# Patient Record
Sex: Female | Born: 1975 | Race: White | Hispanic: No | Marital: Married | Smoking: Current some day smoker
Health system: Southern US, Community
[De-identification: ages and names within clinical notes are randomized; demographics above are authoritative.]

## PROBLEM LIST (undated history)

## (undated) DIAGNOSIS — R569 Unspecified convulsions: Secondary | ICD-10-CM

## (undated) HISTORY — DX: Unspecified convulsions: R56.9

## (undated) HISTORY — PX: TUBAL LIGATION: SHX77

## (undated) HISTORY — PX: CHOLECYSTECTOMY: SHX55

---

## 1998-06-20 ENCOUNTER — Other Ambulatory Visit: Admission: RE | Admit: 1998-06-20 | Discharge: 1998-06-20 | Payer: Self-pay | Admitting: Obstetrics & Gynecology

## 1999-07-02 ENCOUNTER — Other Ambulatory Visit: Admission: RE | Admit: 1999-07-02 | Discharge: 1999-07-02 | Payer: Self-pay | Admitting: Obstetrics & Gynecology

## 1999-10-29 ENCOUNTER — Encounter (HOSPITAL_COMMUNITY): Admission: RE | Admit: 1999-10-29 | Discharge: 1999-11-26 | Payer: Self-pay | Admitting: Obstetrics and Gynecology

## 1999-11-25 ENCOUNTER — Encounter (INDEPENDENT_AMBULATORY_CARE_PROVIDER_SITE_OTHER): Payer: Self-pay

## 1999-11-25 ENCOUNTER — Inpatient Hospital Stay (HOSPITAL_COMMUNITY): Admission: AD | Admit: 1999-11-25 | Discharge: 1999-11-28 | Payer: Self-pay | Admitting: Obstetrics and Gynecology

## 1999-11-30 ENCOUNTER — Encounter: Admission: RE | Admit: 1999-11-30 | Discharge: 2000-01-06 | Payer: Self-pay | Admitting: Obstetrics & Gynecology

## 2003-11-27 ENCOUNTER — Emergency Department (HOSPITAL_COMMUNITY): Admission: EM | Admit: 2003-11-27 | Discharge: 2003-11-28 | Payer: Self-pay | Admitting: Emergency Medicine

## 2003-12-01 ENCOUNTER — Emergency Department (HOSPITAL_COMMUNITY): Admission: EM | Admit: 2003-12-01 | Discharge: 2003-12-01 | Payer: Self-pay | Admitting: *Deleted

## 2004-07-05 ENCOUNTER — Inpatient Hospital Stay: Payer: Self-pay

## 2005-02-07 ENCOUNTER — Encounter: Admission: RE | Admit: 2005-02-07 | Discharge: 2005-02-07 | Payer: Self-pay | Admitting: Family Medicine

## 2005-12-06 ENCOUNTER — Observation Stay: Payer: Self-pay | Admitting: Unknown Physician Specialty

## 2005-12-13 ENCOUNTER — Other Ambulatory Visit: Payer: Self-pay

## 2005-12-13 ENCOUNTER — Emergency Department: Payer: Self-pay | Admitting: Emergency Medicine

## 2005-12-14 ENCOUNTER — Ambulatory Visit: Payer: Self-pay | Admitting: Emergency Medicine

## 2006-03-05 ENCOUNTER — Inpatient Hospital Stay: Payer: Self-pay

## 2006-03-18 ENCOUNTER — Ambulatory Visit: Payer: Self-pay | Admitting: Unknown Physician Specialty

## 2006-03-31 ENCOUNTER — Emergency Department: Payer: Self-pay | Admitting: Emergency Medicine

## 2006-04-28 ENCOUNTER — Ambulatory Visit: Payer: Self-pay | Admitting: Unknown Physician Specialty

## 2006-12-17 ENCOUNTER — Emergency Department: Payer: Self-pay | Admitting: Emergency Medicine

## 2007-11-16 ENCOUNTER — Emergency Department: Payer: Self-pay | Admitting: Emergency Medicine

## 2008-11-23 ENCOUNTER — Emergency Department: Payer: Self-pay | Admitting: Emergency Medicine

## 2008-12-22 ENCOUNTER — Emergency Department: Payer: Self-pay | Admitting: Internal Medicine

## 2009-11-26 ENCOUNTER — Emergency Department: Payer: Self-pay | Admitting: Emergency Medicine

## 2009-12-04 ENCOUNTER — Encounter: Admission: RE | Admit: 2009-12-04 | Discharge: 2009-12-04 | Payer: Self-pay | Admitting: Diagnostic Neuroimaging

## 2010-02-08 ENCOUNTER — Emergency Department: Payer: Self-pay | Admitting: Emergency Medicine

## 2010-02-14 ENCOUNTER — Emergency Department: Payer: Self-pay | Admitting: Unknown Physician Specialty

## 2010-08-21 NOTE — H&P (Signed)
[facility]  Patient:    Bonnie Stone, Bonnie Stone [facility]                    MRN: [id number] Adm. Date:  [id number] Attending:  Shaune Spittle Dictator:   Mack Guise, C.N.M.                         History and Physical  HISTORY OF PRESENT ILLNESS:   Ms. Horsford is a 35 year old gravida 4, para 2-0-1-2, who presents at 38 weeks for induction of labor, secondary to IUGR, cervix 3 cm dilated, 80% effaced with the _________ presenting part at a minus 1 station.  Her pregnancy has been followed by the CNM service at [facility] and is remarkable for:  1. Late prenatal care; 2. Smoker, approximately one pack per day; 3. History of preterm labor with full term delivery; 4. Group B Strep negative.  She was initially seen at the office of [facility] on June 11, 1999, at approximately [redacted] weeks gestation.  Patient was transferred to the M.D. service at 34 weeks, secondary to IUGR noted with serial pregnancy ultrasonography with growth less than 3%.  PRENATAL LABORATORY:          On July 02, 1999, hemoglobin and hematocrit 12.4 and 36.8, platelets 254,000.  Blood type and Rh O positive, antibody screen negative.  VDRL nonreactive, Rubella immune, Hepatitis B surface antigen negative, GC and chlamydia negative.  AFP/free beta HCG within normal range.  On Aug 31, 1999, at 28 weeks, one hour glucose challenge 80 and hemoglobin 12.4.  Culture of vaginal tract at 36 weeks is negative for Group B Strep.  OB HISTORY:                   1991, induced AB; 1995, normal spontaneous vaginal delivery with the birth of a 6 pound 11 ounce female infant with no complications at term; 1997, normal spontaneous vaginal delivery with the birth of a 6 pound 7 ounce female infant with no complications at term, and the present pregnancy.  MEDICAL HISTORY:              History of abnormal Pap smear, history of asthma.  FAMILY HISTORY:               Mother with chronic hypertension, mother  and sister with varicose veins, mother and maternal grandmother with a history of diabetes.  Patients sister with psychiatric disease.  GENETIC HISTORY:              Father of the baby, clubbed foot, and father of the babys nephew, mental retardation and seizures.  SOCIAL HISTORY:               Ms. Latterell is a married 35 year old Caucasian female who works as a Futures trader.  Her husband, [name], is involved and supportive.  He works as an Teacher, adult education.  They are nondenominational in their faith.  REVIEW OF SYSTEMS:            There are no signs or symptoms suggestive of focal or systemic disease and the patient is typical of one with a uterine pregnancy at term.  ALLERGIES:                    PENICILLIN.  HABITS:                       Patient smokes approximately one half  pack of cigarettes per day.  She denies the use of alcohol or illicit drugs.  PHYSICAL EXAMINATION:  VITAL SIGNS:                  Stable, afebrile, fetal heart rate reactive and reassuring.  PELVIC:                       Digital exam of the cervix finds it to be 3 cm dilated, 80% effaced with the _________ presenting part at a minus 1 station, artificial rupture of membranes performed with return of clear fluid.  HEENT:                        Unremarkable.  LUNGS:                        Clear.  HEART:                        Regular rate and rhythm.  ABDOMEN:                      Gravid in its contour.  Uterine fundus is noted to extend 36 cm above the level of the pubic symphysis.  Leopolds maneuver finds the infant to be in a longitudinal lie, cephalic presentation, and the estimated fetal weight is 5-1/2 to 6 pounds.  ASSESSMENT:                   Intrauterine pregnancy at 38 weeks, induction of labor secondary to IUGR.  PLAN:                         Admit to birthing suites per Dr. Cleatrice Burke, routine M.D. orders.  Plan artificial rupture of membranes and Pit induction if no spontaneous labor  occurs. DD:  11/25/99 TD:  11/25/99 Job: [id number]

## 2016-04-24 ENCOUNTER — Emergency Department: Payer: Medicaid Other

## 2016-04-24 ENCOUNTER — Emergency Department
Admission: EM | Admit: 2016-04-24 | Discharge: 2016-04-24 | Disposition: A | Discharge status: Home / Self Care | Payer: Medicaid Other | Attending: Emergency Medicine | Admitting: Emergency Medicine

## 2016-04-24 ENCOUNTER — Encounter: Payer: Self-pay | Admitting: Emergency Medicine

## 2016-04-24 DIAGNOSIS — R51 Headache: Secondary | ICD-10-CM | POA: Insufficient documentation

## 2016-04-24 DIAGNOSIS — F17200 Nicotine dependence, unspecified, uncomplicated: Secondary | ICD-10-CM | POA: Diagnosis not present

## 2016-04-24 DIAGNOSIS — Z5321 Procedure and treatment not carried out due to patient leaving prior to being seen by health care provider: Secondary | ICD-10-CM | POA: Diagnosis not present

## 2016-04-24 LAB — CBC
HCT: 41.5 % (ref 35.0–47.0)
Hemoglobin: 14.4 g/dL (ref 12.0–16.0)
MCH: 33.5 pg (ref 26.0–34.0)
MCHC: 34.8 g/dL (ref 32.0–36.0)
MCV: 96.2 fL (ref 80.0–100.0)
Platelets: 237 K/uL (ref 150–440)
RBC: 4.32 MIL/uL (ref 3.80–5.20)
RDW: 12.5 % (ref 11.5–14.5)
WBC: 9.6 K/uL (ref 3.6–11.0)

## 2016-04-24 LAB — BASIC METABOLIC PANEL WITH GFR
Anion gap: 9 (ref 5–15)
BUN: 9 mg/dL (ref 6–20)
CO2: 26 mmol/L (ref 22–32)
Calcium: 9 mg/dL (ref 8.9–10.3)
Chloride: 101 mmol/L (ref 101–111)
Creatinine, Ser: 0.66 mg/dL (ref 0.44–1.00)
GFR calc Af Amer: >60 mL/min (ref >60)
GFR calc non Af Amer: >60 mL/min (ref >60)
Glucose, Bld: 103 mg/dL — ABNORMAL HIGH (ref 65–99)
Potassium: 3.3 mmol/L — ABNORMAL LOW (ref 3.5–5.1)
Sodium: 136 mmol/L (ref 135–145)

## 2016-04-24 LAB — TROPONIN I: Troponin I: <0.03 ng/mL (ref <0.03)

## 2016-04-24 NOTE — ED Notes (Signed)
Called for pt in lobby; she had been with minor daughter who was being seen in [facility]; called [facility]-patient's daughter has been discharged and they were moved to the lobby; this patient is not answering at this time;

## 2016-04-24 NOTE — ED Triage Notes (Signed)
Pt states has had chest pain for 2 weeks. Pt states is intermittent. Pt states last night she began to have a "migraine" and a nosebleed. Pt states she has felt shob with associated chest pain. Pt appears in no acute distress in triage.

## 2017-04-20 ENCOUNTER — Emergency Department: Payer: Medicaid Other

## 2017-04-20 ENCOUNTER — Encounter: Payer: Self-pay | Admitting: *Deleted

## 2017-04-20 ENCOUNTER — Emergency Department
Admission: EM | Admit: 2017-04-20 | Discharge: 2017-04-21 | Disposition: A | Discharge status: Home / Self Care | Payer: Medicaid Other | Attending: Emergency Medicine | Admitting: Emergency Medicine

## 2017-04-20 ENCOUNTER — Other Ambulatory Visit: Payer: Self-pay

## 2017-04-20 DIAGNOSIS — R300 Dysuria: Secondary | ICD-10-CM | POA: Insufficient documentation

## 2017-04-20 DIAGNOSIS — E869 Volume depletion, unspecified: Secondary | ICD-10-CM | POA: Insufficient documentation

## 2017-04-20 DIAGNOSIS — F17200 Nicotine dependence, unspecified, uncomplicated: Secondary | ICD-10-CM | POA: Insufficient documentation

## 2017-04-20 DIAGNOSIS — E876 Hypokalemia: Secondary | ICD-10-CM | POA: Insufficient documentation

## 2017-04-20 DIAGNOSIS — R112 Nausea with vomiting, unspecified: Secondary | ICD-10-CM | POA: Diagnosis present

## 2017-04-20 DIAGNOSIS — K529 Noninfective gastroenteritis and colitis, unspecified: Secondary | ICD-10-CM

## 2017-04-20 DIAGNOSIS — R569 Unspecified convulsions: Secondary | ICD-10-CM | POA: Diagnosis not present

## 2017-04-20 LAB — URINALYSIS, COMPLETE (UACMP) WITH MICROSCOPIC
Bacteria, UA: NONE SEEN
Bilirubin Urine: NEGATIVE
Glucose, UA: NEGATIVE mg/dL
Ketones, ur: NEGATIVE mg/dL
Nitrite: NEGATIVE
Protein, ur: 100 mg/dL — AB
Specific Gravity, Urine: 1.012 (ref 1.005–1.030)
pH: 5 (ref 5.0–8.0)

## 2017-04-20 LAB — MAGNESIUM: Magnesium: 1.7 mg/dL (ref 1.7–2.4)

## 2017-04-20 LAB — COMPREHENSIVE METABOLIC PANEL
ALT: 12 U/L — ABNORMAL LOW (ref 14–54)
AST: 17 U/L (ref 15–41)
Albumin: 3.6 g/dL (ref 3.5–5.0)
Alkaline Phosphatase: 61 U/L (ref 38–126)
Anion gap: 12 (ref 5–15)
BUN: 6 mg/dL (ref 6–20)
CO2: 23 mmol/L (ref 22–32)
Calcium: 8.9 mg/dL (ref 8.9–10.3)
Chloride: 100 mmol/L — ABNORMAL LOW (ref 101–111)
Creatinine, Ser: 0.85 mg/dL (ref 0.44–1.00)
GFR calc Af Amer: >60 mL/min (ref >60)
GFR calc non Af Amer: >60 mL/min (ref >60)
Glucose, Bld: 108 mg/dL — ABNORMAL HIGH (ref 65–99)
Potassium: 2.8 mmol/L — ABNORMAL LOW (ref 3.5–5.1)
Sodium: 135 mmol/L (ref 135–145)
Total Bilirubin: 1.1 mg/dL (ref 0.3–1.2)
Total Protein: 7.7 g/dL (ref 6.5–8.1)

## 2017-04-20 LAB — LIPASE, BLOOD: Lipase: 22 U/L (ref 11–51)

## 2017-04-20 LAB — CBC
HCT: 39.8 % (ref 35.0–47.0)
Hemoglobin: 13.8 g/dL (ref 12.0–16.0)
MCH: 32.7 pg (ref 26.0–34.0)
MCHC: 34.8 g/dL (ref 32.0–36.0)
MCV: 94.1 fL (ref 80.0–100.0)
Platelets: 258 10*3/uL (ref 150–440)
RBC: 4.23 MIL/uL (ref 3.80–5.20)
RDW: 11.9 % (ref 11.5–14.5)
WBC: 17.9 10*3/uL — ABNORMAL HIGH (ref 3.6–11.0)

## 2017-04-20 MED ORDER — POTASSIUM CHLORIDE 10 MEQ/100ML IV SOLN
10.0000 meq | Freq: Once | INTRAVENOUS | Status: AC
  Administered 2017-04-21: 10 meq via INTRAVENOUS
  Filled 2017-04-20: qty 100

## 2017-04-20 MED ORDER — ONDANSETRON HCL 4 MG/2ML IJ SOLN
4.0000 mg | INTRAMUSCULAR | Status: AC
  Administered 2017-04-21: 4 mg via INTRAVENOUS
  Filled 2017-04-20: qty 2

## 2017-04-20 MED ORDER — SODIUM CHLORIDE 0.9 % IV BOLUS (SEPSIS)
1000.0000 mL | INTRAVENOUS | Status: AC
  Administered 2017-04-21: 1000 mL via INTRAVENOUS

## 2017-04-20 NOTE — ED Notes (Signed)
Urine pregnancy POC negative.

## 2017-04-20 NOTE — ED Provider Notes (Signed)
[facility] Emergency Department Provider Note  ____________________________________________   First MD Initiated Contact with Patient 04/20/17 2258     (approximate)  I have reviewed the triage vital signs and the nursing notes.   HISTORY  Chief Complaint Emesis    HPI EMMALYN Stone is a 42 y.o. female with no significant chronic medical history and surgical history as listed below who presents by private vehicle for evaluation of a variety of complaints but the primary complaint is intractable vomiting over the last 2-3 days.  The patient reports that on Monday (it is currently Wednesday) she developed some nausea which gradually but relatively rapidly over the course of the day developed into vomiting and simultaneous diarrhea.  She has lost track of the number of times that she has vomited over the last several days but it was numerous.  The diarrhea improved over the last 24 hours and she has not had any more bowel movements but has continued to vomit.  She has not been able to eat or drink anything.  She states that her ribs hurt from vomiting so much but she has no abdominal pain.  She feels a little bit lightheaded.  She also has a generalized throbbing headache that has developed gradually today.  Her family reports that when she was in the vehicle being driven to the emergency department she had 2 separate episodes where she became unresponsive, shaking all over, and was foaming at the mouth.  The episodes reportedly lasted a minute or less and she was slightly confused afterwards but very rapidly came back to her baseline.  She has no history of seizures but she does have a son with epilepsy.  She denies fever/chills, chest pain, shortness of breath, abdominal pain, and dysuria.  She has not been ill recently.  She does not drink alcohol and ate nothing that she can think of that would have made her sick.  No sick family members.  She has no neck pain nor  neck stiffness.  She is not confused at this time and reportedly was not particularly confused after her seizure-like activity although she has no memory of them.  She does not remember feeling like she was going to pass out.  History reviewed. No pertinent past medical history.  There are no active problems to display for this patient.   Past Surgical History:  Procedure Laterality Date  . CHOLECYSTECTOMY    . TUBAL LIGATION      Prior to Admission medications   Medication Sig Start Date End Date Taking? Authorizing Provider  ondansetron (ZOFRAN ODT) 4 MG disintegrating tablet Allow 1-2 tablets to dissolve in your mouth every 8 hours as needed for nausea/vomiting 04/21/17   Loleta Rose, MD  potassium chloride SA (KLOR-CON M20) 20 MEQ tablet Take 1 tablet (20 mEq total) by mouth daily. 04/21/17   Loleta Rose, MD    Allergies Penicillins  No family history on file.  Social History Social History   Tobacco Use  . Smoking status: Current Some Day Smoker  . Smokeless tobacco: Never Used  Substance Use Topics  . Alcohol use: No  . Drug use: No    Review of Systems Constitutional: No fever/chills Eyes: No visual changes.  No photophobia. ENT: No sore throat.  No neck pain or stiffness. Cardiovascular: Denies chest pain. Respiratory: Denies shortness of breath. Gastrointestinal: Tractable vomiting as described above.  2 days of diarrhea that has now improved. Genitourinary: Negative for dysuria. Musculoskeletal: Negative for  neck pain.  Negative for back pain. Integumentary: Negative for rash. Neurological: Reported seizure-like activity on 2 separate occasions on the way to the emergency department with no prior history.  Questionable postictal state.  Global throbbing headache, no focal numbness nor weakness   ____________________________________________   PHYSICAL EXAM:  VITAL SIGNS: ED Triage Vitals  Enc Vitals Group     BP 04/20/17 1847 108/88     Pulse Rate  04/20/17 1847 100     Resp 04/20/17 1847 20     Temp 04/20/17 1847 98 F (36.7 C)     Temp Source 04/20/17 1847 Oral     SpO2 04/20/17 1847 98 %     Weight 04/20/17 1847 74.4 kg (164 lb)     Height 04/20/17 1847 1.626 m (5\' 4" )     Head Circumference --      Peak Flow --      Pain Score 04/20/17 1846 7     Pain Loc --      Pain Edu? --      Excl. in GC? --     Constitutional: Alert and oriented. Well appearing and in no acute distress. Eyes: Conjunctivae are normal.  Head: Atraumatic. Nose: No congestion/rhinnorhea. Mouth/Throat: Mucous membranes are dry. Neck: No stridor.  No meningeal signs.   Cardiovascular: Borderline tachycardia, regular rhythm. Good peripheral circulation. Grossly normal heart sounds.  Some mild chest wall tenderness in the inferior rib cage bilaterally. Respiratory: Normal respiratory effort.  No retractions. Lungs CTAB. Gastrointestinal: Soft and nontender even to deep palpation throughout. No distention.  Musculoskeletal: No lower extremity tenderness nor edema. No gross deformities of extremities. Neurologic:  Normal speech and language. No gross focal neurologic deficits are appreciated.  Skin:  Skin is warm, dry and intact. No rash noted. Psychiatric: Mood and affect are normal. Speech and behavior are normal.  ____________________________________________   LABS (all labs ordered are listed, but only abnormal results are displayed)  Labs Reviewed  COMPREHENSIVE METABOLIC PANEL - Abnormal; Notable for the following components:      Result Value   Potassium 2.8 (*)    Chloride 100 (*)    Glucose, Bld 108 (*)    ALT 12 (*)    All other components within normal limits  CBC - Abnormal; Notable for the following components:   WBC 17.9 (*)    All other components within normal limits  URINALYSIS, COMPLETE (UACMP) WITH MICROSCOPIC - Abnormal; Notable for the following components:   Color, Urine AMBER (*)    APPearance TURBID (*)    Hgb urine  dipstick MODERATE (*)    Protein, ur 100 (*)    Leukocytes, UA LARGE (*)    Squamous Epithelial / LPF 0-5 (*)    Non Squamous Epithelial 0-5 (*)    All other components within normal limits  URINE CULTURE  LIPASE, BLOOD  MAGNESIUM  LACTIC ACID, PLASMA  LACTIC ACID, PLASMA  POC URINE PREG, ED   ____________________________________________   RADIOLOGY   Dg Chest 2 View  Result Date: 04/21/2017 CLINICAL DATA:  42 y/o  F; coughing, questionable aspiration. EXAM: CHEST  2 VIEW COMPARISON:  04/24/2016 chest radiograph FINDINGS: Stable heart size and mediastinal contours are within normal limits. Both lungs are clear. The visualized skeletal structures are unremarkable. Right upper quadrant cholecystectomy clips. IMPRESSION: No active cardiopulmonary disease. Electronically Signed   By: Mitzi Hansen M.D.   On: 04/21/2017 02:10   Ct Head Wo Contrast  Result Date: 04/21/2017 CLINICAL DATA:  42 y/o  F; severe headache. EXAM: CT HEAD WITHOUT CONTRAST TECHNIQUE: Contiguous axial images were obtained from the base of the skull through the vertex without intravenous contrast. COMPARISON:  None. FINDINGS: Brain: No evidence of acute infarction, hemorrhage, hydrocephalus, extra-axial collection or mass lesion/mass effect. Vascular: No hyperdense vessel or unexpected calcification. Skull: Normal. Negative for fracture or focal lesion. Sinuses/Orbits: No acute finding. Other: None. IMPRESSION: Normal CT of the head. Electronically Signed   By: Mitzi HansenLance  Furusawa-Stratton M.D.   On: 04/21/2017 00:13    ____________________________________________   PROCEDURES  Critical Care performed: No   Procedure(s) performed:   Procedures   ____________________________________________   INITIAL IMPRESSION / ASSESSMENT AND PLAN / ED COURSE  As part of my medical decision making, I reviewed the following data within the electronic MEDICAL RECORD NUMBER History obtained from family, Nursing notes  reviewed and incorporated, Labs reviewed  and Radiograph reviewed     Differential diagnosis includes, but is not limited to, gastritis, viral gastroenteritis, acute intra-abdominal infection such as appendicitis, small bowel obstruction/ileus, intracranial emergency such as CVA or intracranial bleeding, intracranial neoplasm, meningitis/encephalitis.  In spite of the patient's history of present illness, she is quite well-appearing at this time and is alert and oriented and appropriate.  Her blood pressure is a bit low which is consistent with volume depletion and she has borderline tachycardia.  In spite of her numerous episodes of vomiting she does not have any abdominal pain and no clinically significant tenderness to palpation.  I believe that she had does have some musculoskeletal tenderness due to the repeated vomiting but at this point I am not concerned about an acute intra-abdominal pathology other than possible gastritis.  However she describes her symptoms began with obtaining his vomiting and diarrhea and is possible this all started as a viral pathogen.  The seizure-like activity could be the result of volume depletion and even a vasovagal episode or orthostatic episode which led to brief seizure-like activity with no real postictal state.  However given that she has no prior history of seizure I will obtain a noncontrast T scan of her head to rule out any intracranial bleeding and look for any sign of a tumor.  Treatment right now includes a liter of normal saline potassium will need to be repleted but I will see if she can tolerate any oral potassium after the Zofran.  I will reassess after imaging and volume resuscitation.  I have also added on a magnesium and ordered a lactic acid.  Clinical Course as of Apr 21 526  Wed Apr 20, 2017  2318 Potassium: (!) 2.8 [CF]  2318 WBC: (!) 17.9 [CF]  2318 WBC, UA: TOO NUMEROUS TO COUNT [CF]  Thu Apr 21, 2017  0020 No acute abnormalities on CT  scan. CT Head Wo Contrast [CF]  0037 Patient does not remember what allergy she may have had as a child to penicillins.  Given the very low probability of cross-reactivity, I am treating the UTI with ceftriaxone.  [CF]  0102 Lactate is normal which is reassuring given the seizure-like activity.   Lactic Acid, Venous: 1.0 [CF]  0117 normal Magnesium: 1.7 [CF]  0230 Patient is well-appearing and in no acute distress.  She has tolerated oral fluids and oral potassium.  She has been laughing and carrying on with her children for an extended period of time and is in no distress.  She is comfortable with plan to go home.  Since she was able to tolerate 40  mEq of potassium by mouth and 10 by IV I think that that is appropriate but I will write her a prescription for Zofran and potassium supplements.  I gave my usual and customary return precautions.   [CF]    Clinical Course User Index [CF] Loleta Rose, MD    ____________________________________________  FINAL CLINICAL IMPRESSION(S) / ED DIAGNOSES  Final diagnoses:  Gastroenteritis  Hypokalemia  Seizure-like activity (HCC)  Volume depletion     MEDICATIONS GIVEN DURING THIS VISIT:  Medications  sodium chloride 0.9 % bolus 1,000 mL (0 mLs Intravenous Stopped 04/21/17 0207)  ondansetron (ZOFRAN) injection 4 mg (4 mg Intravenous Given 04/21/17 0020)  potassium chloride 10 mEq in 100 mL IVPB (0 mEq Intravenous Stopped 04/21/17 0126)  cefTRIAXone (ROCEPHIN) 1 g in dextrose 5 % 50 mL IVPB - Premix (0 g Intravenous Stopped 04/21/17 0207)  potassium chloride (KLOR-CON) packet 40 mEq (40 mEq Oral Given 04/21/17 0206)     ED Discharge Orders        Ordered    ondansetron (ZOFRAN ODT) 4 MG disintegrating tablet     04/21/17 0235    potassium chloride SA (KLOR-CON M20) 20 MEQ tablet  Daily     04/21/17 0235       Note:  This document was prepared using Dragon voice recognition software and may include unintentional dictation errors.      Loleta Rose, MD 04/21/17 [phone]

## 2017-04-20 NOTE — ED Notes (Addendum)
First Nurse note:  Patient brought in by POV c/o N/V for 2 days.  Patient's daughters states that she had 2 witnessed seizures in the car where the patient was unresponsive, jerking, and foaming at the mouth.

## 2017-04-20 NOTE — ED Triage Notes (Addendum)
Pt to triage via wheelchair.  Pt has vomiting.  No abd pain.   Pt has a headache.   Pt tylenol without pain relief.   Pt alert   Speech clear.  Children state pt has been shaking all over today like a seizure.  No hx of seizures.  No meds for seizures.

## 2017-04-21 ENCOUNTER — Emergency Department: Payer: Medicaid Other

## 2017-04-21 LAB — LACTIC ACID, PLASMA: LACTIC ACID, VENOUS: 1 mmol/L (ref 0.5–1.9)

## 2017-04-21 MED ORDER — POTASSIUM CHLORIDE CRYS ER 20 MEQ PO TBCR: 20.0000 meq | EXTENDED_RELEASE_TABLET | Freq: Every day | ORAL | 0 refills | Status: DC

## 2017-04-21 MED ORDER — POTASSIUM CHLORIDE 20 MEQ PO PACK
40.0000 meq | PACK | ORAL | Status: AC
  Administered 2017-04-21: 40 meq via ORAL
  Filled 2017-04-21: qty 2

## 2017-04-21 MED ORDER — CEFTRIAXONE SODIUM IN DEXTROSE 20 MG/ML IV SOLN
1.0000 g | INTRAVENOUS | Status: AC
  Administered 2017-04-21: 1 g via INTRAVENOUS
  Filled 2017-04-21: qty 50

## 2017-04-21 MED ORDER — ONDANSETRON 4 MG PO TBDP: ORAL_TABLET | ORAL | 0 refills | Status: DC

## 2017-04-21 NOTE — ED Notes (Signed)
Pt did not wait for RN to remove IV. Pt daughter removed Iv

## 2017-04-21 NOTE — Discharge Instructions (Signed)

## 2017-04-22 LAB — POCT PREGNANCY, URINE: PREG TEST UR: NEGATIVE

## 2017-04-23 LAB — URINE CULTURE
Culture: >=100000 — AB
Special Requests: NORMAL

## 2017-04-24 NOTE — Progress Notes (Signed)
ED Antimicrobial Stewardship Positive Culture Follow Up   Bonnie Stone is an 42 y.o. female who [facility] with a chief complaint of emesis.   Chief Complaint  Patient presents with  . Emesis    Recent Results (from the past 720 hour(s))  Urine Culture     Status: Abnormal   Collection Time: 04/20/17  6:50 PM  Result Value Ref Range Status   Specimen Description   Final    URINE, RANDOM Performed at [facility], [address] [place], [place] [address]    Special Requests   Final    Normal Performed at [facility], [address] [place], [place] [address]    Culture >=100,000 COLONIES/mL ESCHERICHIA COLI (A)  Final   Report Status 04/23/2017 FINAL  Final   Organism ID, Bacteria ESCHERICHIA COLI (A)  Final      Susceptibility   Escherichia coli - MIC*    AMPICILLIN >=32 RESISTANT Resistant     CEFAZOLIN <=4 SENSITIVE Sensitive     CEFTRIAXONE <=1 SENSITIVE Sensitive     CIPROFLOXACIN <=0.25 SENSITIVE Sensitive     GENTAMICIN <=1 SENSITIVE Sensitive     IMIPENEM <=0.25 SENSITIVE Sensitive     NITROFURANTOIN <=16 SENSITIVE Sensitive     TRIMETH/SULFA <=20 SENSITIVE Sensitive     AMPICILLIN/SULBACTAM 4 SENSITIVE Sensitive     PIP/TAZO <=4 SENSITIVE Sensitive     Extended ESBL NEGATIVE Sensitive     * >=100,000 COLONIES/mL ESCHERICHIA COLI    []  Treated with --, organism resistant to prescribed antimicrobial [x]  Patient discharged originally without antimicrobial agent and treatment is now indicated  New antibiotic prescription: Keflex 500mg  BID x 5 days if patient symptomatic. Could not get in touch with patient due to phone on chart not in service.   ED Provider: Dr. Mellody Danceifenbark    Casy Brunetto, PharmD  Pharmacy Resident  04/24/2017, 3:09 PM

## 2019-11-04 ENCOUNTER — Emergency Department
Admission: EM | Admit: 2019-11-04 | Discharge: 2019-11-04 | Disposition: A | Discharge status: Home / Self Care | Payer: Medicaid Other | Attending: Emergency Medicine | Admitting: Emergency Medicine

## 2019-11-04 ENCOUNTER — Other Ambulatory Visit: Payer: Self-pay

## 2019-11-04 DIAGNOSIS — L500 Allergic urticaria: Secondary | ICD-10-CM | POA: Diagnosis not present

## 2019-11-04 DIAGNOSIS — Y939 Activity, unspecified: Secondary | ICD-10-CM | POA: Insufficient documentation

## 2019-11-04 DIAGNOSIS — F17200 Nicotine dependence, unspecified, uncomplicated: Secondary | ICD-10-CM | POA: Insufficient documentation

## 2019-11-04 DIAGNOSIS — Y999 Unspecified external cause status: Secondary | ICD-10-CM | POA: Insufficient documentation

## 2019-11-04 DIAGNOSIS — X58XXXA Exposure to other specified factors, initial encounter: Secondary | ICD-10-CM | POA: Diagnosis not present

## 2019-11-04 DIAGNOSIS — T782XXA Anaphylactic shock, unspecified, initial encounter: Secondary | ICD-10-CM

## 2019-11-04 DIAGNOSIS — L509 Urticaria, unspecified: Secondary | ICD-10-CM

## 2019-11-04 DIAGNOSIS — T7840XA Allergy, unspecified, initial encounter: Secondary | ICD-10-CM

## 2019-11-04 DIAGNOSIS — Y929 Unspecified place or not applicable: Secondary | ICD-10-CM | POA: Insufficient documentation

## 2019-11-04 MED ORDER — PREDNISONE 50 MG PO TABS: 50.0000 mg | ORAL_TABLET | Freq: Every day | ORAL | 0 refills | Status: DC

## 2019-11-04 MED ORDER — DIPHENHYDRAMINE HCL 25 MG PO TABS: 25.0000 mg | ORAL_TABLET | Freq: Four times a day (QID) | ORAL | 0 refills | Status: DC | PRN

## 2019-11-04 MED ORDER — EPINEPHRINE 0.3 MG/0.3ML IJ SOAJ: 0.3000 mg | INTRAMUSCULAR | 1 refills | Status: AC | PRN

## 2019-11-04 NOTE — ED Triage Notes (Signed)
Pt arrives from home via [facility] after an allergic reaction to being stung by wasp or yellowjacket on left middle finger. 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 Pt was in and out of conciousness for EMS and had hives w/ vomitting. Pt received 0.3 epiinephrine, 50mg  benadryl, 4mg  zofran, 20 mg pepcid, 125 solu medrol, and 750 ml NS pta.

## 2019-11-04 NOTE — ED Provider Notes (Signed)
[facility] Emergency Department Provider Note   ____________________________________________    I have reviewed the triage vital signs and the nursing notes.   HISTORY  Chief Complaint Allergic Reaction     HPI Bonnie Stone is a 44 y.o. female who presents with complaints of allergic reaction after bee or wasp sting.  She reports this happened just prior to arrival.  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  She reports she had immediate swelling at the site of her left finger, hives and a scratchy throat.  She denied any difficulty breathing.  EMS has administered epinephrine IM, Benadryl, Pepcid, Solu-Medrol.  She reports she is feeling much better and wants to leave  No past medical history on file.  There are no problems to display for this patient.   Past Surgical History:  Procedure Laterality Date  . CHOLECYSTECTOMY    . TUBAL LIGATION      Prior to Admission medications   Medication Sig Start Date End Date Taking? Authorizing Provider  diphenhydrAMINE (BENADRYL ALLERGY) 25 MG tablet Take 1 tablet (25 mg total) by mouth every 6 (six) hours as needed. 11/04/19   Jene Every, MD  EPINEPHrine 0.3 mg/0.3 mL IJ SOAJ injection Inject 0.3 mLs (0.3 mg total) into the muscle as needed for anaphylaxis. 11/04/19   Jene Every, MD  ondansetron (ZOFRAN ODT) 4 MG disintegrating tablet Allow 1-2 tablets to dissolve in your mouth every 8 hours as needed for nausea/vomiting 04/21/17   Loleta Rose, MD  potassium chloride SA (KLOR-CON M20) 20 MEQ tablet Take 1 tablet (20 mEq total) by mouth daily. 04/21/17   Loleta Rose, MD  predniSONE (DELTASONE) 50 MG tablet Take 1 tablet (50 mg total) by mouth daily with breakfast. 11/04/19   Jene Every, MD     Allergies Penicillins  No family history on file.  Social History Social History   Tobacco Use  . Smoking status: Current Some Day Smoker  . Smokeless tobacco: Never Used   Substance Use Topics  . Alcohol use: No  . Drug use: No    Review of Systems  Constitutional: No fever/chills Eyes: No visual changes.  ENT: No throat swelling Cardiovascular: Denies chest pain. Respiratory: No chest tightness Gastrointestinal: No abdominal pain.  No nausea, no vomiting.   Genitourinary: Negative for dysuria. Musculoskeletal: Negative for back pain. Skin: As above Neurological: Negative for headaches   ____________________________________________   PHYSICAL EXAM:  VITAL SIGNS: ED Triage Vitals  Enc Vitals Group     BP 11/04/19 1520 119/81     Pulse Rate 11/04/19 1520 98     Resp 11/04/19 1520 21     Temp 11/04/19 1520 98.3 F (36.8 C)     Temp Source 11/04/19 1520 Oral     SpO2 11/04/19 1520 100 %     Weight 11/04/19 1522 61.7 kg (136 lb)     Height 11/04/19 1522 1.6 m (5\' 3" )     Head Circumference --      Peak Flow --      Pain Score 11/04/19 1521 4     Pain Loc --      Pain Edu? --      Excl. in GC? --     Constitutional: Alert and oriented.  . Nose: No congestion/rhinnorhea. Mouth/Throat: Mucous membranes are moist.  Pharynx normal Neck:  Painless ROM Cardiovascular: Normal rate, regular rhythm.  Good peripheral circulation. Respiratory: Normal respiratory effort.  No retractions. Lungs  CTAB. Gastrointestinal: Soft and nontender. No distention.   Musculoskeletal: No lower extremity tenderness nor edema.  Warm and well perfused.  No swelling Neurologic:  Normal speech and language. No gross focal neurologic deficits are appreciated.  Skin:  Skin is warm, dry and intact.  No urticaria Psychiatric: Mood and affect are normal. Speech and behavior are normal.  ____________________________________________   LABS (all labs ordered are listed, but only abnormal results are displayed)  Labs Reviewed - No data to display ____________________________________________  EKG ED ECG REPORT I, Jene Every, the attending physician, personally  viewed and interpreted this ECG.  Date: 11/04/2019  Rhythm: normal sinus rhythm QRS Axis: normal Intervals: normal ST/T Wave abnormalities: normal Narrative Interpretation: no evidence of acute ischemia  ____________________________________________  RADIOLOGY  None ____________________________________________   PROCEDURES  Procedure(s) performed: No  Procedures   Critical Care performed: No ____________________________________________   INITIAL IMPRESSION / ASSESSMENT AND PLAN / ED COURSE  Pertinent labs & imaging results that were available during my care of the patient were reviewed by me and considered in my medical decision making (see chart for details).  Patient presents after reported allergic reaction to bee or wasp sting.  Initial reaction was swelling, hives and scratchy throat.  All symptoms have resolved after appropriate treatment by EMS.  Discussed with patient that her reaction is consistent with anaphylaxis and we recommend at least for observation and possible admission.  She is noted that she is not willing to do that, states that she will stay for half an hour and then she has to go.  I did describe the risk of recurrence of allergic reaction.  She does have decisional capacity.    ____________________________________________   FINAL CLINICAL IMPRESSION(S) / ED DIAGNOSES  Final diagnoses:  Allergic reaction, initial encounter  Urticaria  Anaphylaxis, initial encounter        Note:  This document was prepared using Dragon voice recognition software and may include unintentional dictation errors.   Jene Every, MD 11/04/19 [phone]

## 2020-01-23 ENCOUNTER — Other Ambulatory Visit: Payer: Self-pay | Admitting: Family Medicine

## 2020-01-23 DIAGNOSIS — M79661 Pain in right lower leg: Secondary | ICD-10-CM

## 2020-01-24 ENCOUNTER — Other Ambulatory Visit: Payer: Self-pay

## 2020-01-24 ENCOUNTER — Ambulatory Visit
Admission: RE | Admit: 2020-01-24 | Discharge: 2020-01-24 | Disposition: A | Discharge status: Home / Self Care | Payer: Medicaid Other | Source: Ambulatory Visit | Attending: Family Medicine | Admitting: Family Medicine

## 2020-01-24 DIAGNOSIS — M79661 Pain in right lower leg: Secondary | ICD-10-CM | POA: Diagnosis not present

## 2020-02-08 ENCOUNTER — Other Ambulatory Visit: Payer: Self-pay

## 2020-02-08 ENCOUNTER — Encounter (INDEPENDENT_AMBULATORY_CARE_PROVIDER_SITE_OTHER): Payer: Self-pay | Admitting: Vascular Surgery

## 2020-02-08 ENCOUNTER — Ambulatory Visit (INDEPENDENT_AMBULATORY_CARE_PROVIDER_SITE_OTHER): Payer: Medicaid Other | Admitting: Vascular Surgery

## 2020-02-08 DIAGNOSIS — M79609 Pain in unspecified limb: Secondary | ICD-10-CM | POA: Insufficient documentation

## 2020-02-08 DIAGNOSIS — M7989 Other specified soft tissue disorders: Secondary | ICD-10-CM

## 2020-02-08 DIAGNOSIS — M79604 Pain in right leg: Secondary | ICD-10-CM | POA: Diagnosis not present

## 2020-02-08 NOTE — Progress Notes (Signed)
Patient ID: Bonnie Stone, female   DOB: 1976/02/13, 44 y.o.   MRN: [id number]  Chief Complaint  Patient presents with  . New Patient (Initial Visit)    RLE pain    HPI Bonnie Stone is a 44 y.o. female.  I am asked to see the patient by Dr. Nathanial Rancher for evaluation of right leg pain.  This is associated with intermittent swelling as well.  She has noticed this now for a couple of years.  It has been a gradually progressive problem.  She has been wearing compression stockings now for many months with no improvement in her pain or swelling.  Left leg occasionally hurts her and occasionally swells but nowhere near as much as the right.  When she has been on her feet or moving around a lot the pain is worse.  Activity does seem to exacerbate the pain.  No personal history of blood clots or thrombophlebitis to her knowledge.  She had a negative DVT study earlier this year.  She does say that she has several family members who have had vascular issues.  No open wounds or infection.  Nothing really makes the pain much better.     No past medical history on file.  Past Surgical History:  Procedure Laterality Date  . CHOLECYSTECTOMY    . TUBAL LIGATION       Family History No bleeding disorders Sister has had venous issues. Multiple other family members have had clots and hardening of the arteries. No aneurysms    Social History   Tobacco Use  . Smoking status: Current Some Day Smoker  . Smokeless tobacco: Never Used  Substance Use Topics  . Alcohol use: No  . Drug use: No     Allergies  Allergen Reactions  . Bee Venom Anaphylaxis  . Penicillins     Current Outpatient Medications  Medication Sig Dispense Refill  . EPINEPHrine 0.3 mg/0.3 mL IJ SOAJ injection Inject 0.3 mLs (0.3 mg total) into the muscle as needed for anaphylaxis. 1 each 1  . diphenhydrAMINE (BENADRYL ALLERGY) 25 MG tablet Take 1 tablet (25 mg total) by mouth every 6 (six) hours as needed. (Patient not  taking: Reported on 02/08/2020) 30 tablet 0  . ondansetron (ZOFRAN ODT) 4 MG disintegrating tablet Allow 1-2 tablets to dissolve in your mouth every 8 hours as needed for nausea/vomiting (Patient not taking: Reported on 02/08/2020) 30 tablet 0  . potassium chloride SA (KLOR-CON M20) 20 MEQ tablet Take 1 tablet (20 mEq total) by mouth daily. (Patient not taking: Reported on 02/08/2020) 7 tablet 0  . predniSONE (DELTASONE) 50 MG tablet Take 1 tablet (50 mg total) by mouth daily with breakfast. (Patient not taking: Reported on 02/08/2020) 5 tablet 0   No current facility-administered medications for this visit.      REVIEW OF SYSTEMS (Negative unless checked)  Constitutional: [] Weight loss  [] Fever  [] Chills Cardiac: [] Chest pain   [] Chest pressure   [] Palpitations   [] Shortness of breath when laying flat   [] Shortness of breath at rest   [] Shortness of breath with exertion. Vascular:  [x] Pain in legs with walking   [x] Pain in legs at rest   [] Pain in legs when laying flat   [] Claudication   [] Pain in feet when walking  [] Pain in feet at rest  [] Pain in feet when laying flat   [] History of DVT   [] Phlebitis   [x] Swelling in legs   [] Varicose veins   [] Non-healing ulcers Pulmonary:   []   Uses home oxygen   [] Productive cough   [] Hemoptysis   [] Wheeze  [] COPD   [] Asthma Neurologic:  [] Dizziness  [] Blackouts   [] Seizures   [] History of stroke   [] History of TIA  [] Aphasia   [] Temporary blindness   [] Dysphagia   [] Weakness or numbness in arms   [] Weakness or numbness in legs Musculoskeletal:  [] Arthritis   [] Joint swelling   [] Joint pain   [] Low back pain Hematologic:  [] Easy bruising  [] Easy bleeding   [] Hypercoagulable state   [] Anemic  [] Hepatitis Gastrointestinal:  [] Blood in stool   [] Vomiting blood  [] Gastroesophageal reflux/heartburn   [] Abdominal pain Genitourinary:  [] Chronic kidney disease   [] Difficult urination  [] Frequent urination  [] Burning with urination   [] Hematuria Skin:  [] Rashes    [] Ulcers   [] Wounds Psychological:  [] History of anxiety   []  History of major depression.    Physical Exam BP 132/90   Pulse 71   Ht 5\' 4"  (1.626 m)   Wt 141 lb (64 kg)   BMI 24.20 kg/m  Gen:  WD/WN, NAD Head: [place]/AT, No temporalis wasting. Ear/Nose/Throat: Hearing grossly intact, nares w/o erythema or drainage, oropharynx w/o Erythema/Exudate Eyes: Conjunctiva clear, sclera non-icteric  Neck: trachea midline.  No JVD.  Pulmonary:  Good air movement, respirations not labored, no use of accessory muscles  Cardiac: RRR, no JVD Vascular:  Vessel Right Left  Radial Palpable Palpable                          DP  1+  2+  PT  1+  1+   Gastrointestinal:. No masses, surgical incisions, or scars. Musculoskeletal: M/S 5/5 throughout.  Extremities without ischemic changes.  No deformity or atrophy.  Trace bilateral lower extremity edema. Neurologic: Sensation grossly intact in extremities.  Symmetrical.  Speech is fluent. Motor exam as listed above. Psychiatric: Judgment intact, Mood & affect appropriate for pt's clinical situation. Dermatologic: No rashes or ulcers noted.  No cellulitis or open wounds.    Radiology Venous Img Lower Unilateral Right (DVT)  Result Date: 01/24/2020 CLINICAL DATA:  Right lower extremity pain. History of smoking. Evaluate for DVT. EXAM: RIGHT LOWER EXTREMITY VENOUS DOPPLER ULTRASOUND TECHNIQUE: Gray-scale sonography with graded compression, as well as color Doppler and duplex ultrasound were performed to evaluate the lower extremity deep venous systems from the level of the common femoral vein and including the common femoral, femoral, profunda femoral, popliteal and calf veins including the posterior tibial, peroneal and gastrocnemius veins when visible. The superficial great saphenous vein was also interrogated. Spectral Doppler was utilized to evaluate flow at rest and with distal augmentation maneuvers in the common femoral, femoral and popliteal  veins. COMPARISON:  None. FINDINGS: Contralateral Common Femoral Vein: Respiratory phasicity is normal and symmetric with the symptomatic side. No evidence of thrombus. Normal compressibility. Common Femoral Vein: No evidence of thrombus. Normal compressibility, respiratory phasicity and response to augmentation. Saphenofemoral Junction: No evidence of thrombus. Normal compressibility and flow on color Doppler imaging. Profunda Femoral Vein: No evidence of thrombus. Normal compressibility and flow on color Doppler imaging. Femoral Vein: No evidence of thrombus. Normal compressibility, respiratory phasicity and response to augmentation. Popliteal Vein: No evidence of thrombus. Normal compressibility, respiratory phasicity and response to augmentation. Calf Veins: No evidence of thrombus. Normal compressibility and flow on color Doppler imaging. Superficial Great Saphenous Vein: No evidence of thrombus. Normal compressibility. Venous Reflux:  None. Other Findings:  None. IMPRESSION: No evidence of DVT within the  right lower extremity. Electronically Signed   By: Simonne Come M.D.   On: 01/24/2020 14:56    Labs No results found for this or any previous visit (from the past 2160 hour(s)).  Assessment/Plan:  Pain in limb  Recommend:  The patient has atypical pain symptoms for pure atherosclerotic disease. 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  Noninvasive studies including ABI's and venous ultrasound of the legs will be obtained and the patient will follow up with me to review these studies.  If the patient's noninvasive studies do not show significant vascular disease, suspect the patient is c/o pseudoclaudication.  Patient should have an evaluation of his LS spine which I defer to the primary service.  The patient should continue walking and begin a more formal exercise  program. The patient should continue his antiplatelet therapy and aggressive treatment of the lipid abnormalities.  The patient should begin wearing graduated compression socks 15-20 mmHg strength to control edema.   Swelling of limb I have had a long discussion with the patient regarding swelling and why it  causes symptoms.  Patient will begin wearing graduated compression stockings class 1 (20-30 mmHg) on a daily basis.  She already has these and has been doing this now for several months. The patient will  beginning wearing the stockings first thing in the morning and removing them in the evening. The patient is instructed specifically not to sleep in the stockings.   In addition, behavioral modification will be initiated.  This will include frequent elevation, use of over the counter pain medications and exercise such as walking.  I have reviewed systemic causes for chronic edema such as liver, kidney and cardiac etiologies.  The patient denies problems with these organ systems.    Consideration for a lymph pump will also be made based upon the effectiveness of conservative therapy.  This would help to improve the edema control and prevent sequela such as ulcers and infections   Patient should undergo duplex ultrasound of the venous system to ensure that DVT or reflux is not present.  The patient will follow-up with me after the ultrasound.        Festus Barren 02/08/2020, 10:47 AM   This note was created with Dragon medical transcription system.  Any errors from dictation are unintentional.

## 2020-02-08 NOTE — Patient Instructions (Signed)
Peripheral Vascular Disease  Peripheral vascular disease (PVD) is a disease of the blood vessels that are not part of your heart and brain. A simple term for PVD is poor circulation. In most cases, PVD narrows the blood vessels that carry blood from your heart to the rest of your body. This can reduce the supply of blood to your arms, legs, and internal organs, like your stomach or kidneys. However, PVD most often affects a person's lower legs and feet. Without treatment, PVD tends to get worse. PVD can also lead to acute ischemic limb. This is when an arm or leg suddenly cannot get enough blood. This is a medical emergency. Follow these instructions at home: Lifestyle  Do not use any products that contain nicotine or tobacco, such as cigarettes and e-cigarettes. If you need help quitting, ask your doctor.  Lose weight if you are overweight. Or, stay at a healthy weight as told by your doctor.  Eat a diet that is low in fat and cholesterol. If you need help, ask your doctor.  Exercise regularly. Ask your doctor for activities that are right for you. General instructions  Take over-the-counter and prescription medicines only as told by your doctor.  Take good care of your feet: ? Wear comfortable shoes that fit well. ? Check your feet often for any cuts or sores.  Keep all follow-up visits as told by your doctor This is important. Contact a doctor if:  You have cramps in your legs when you walk.  You have leg pain when you are at rest.  You have coldness in a leg or foot.  Your skin changes.  You are unable to get or have an erection (erectile dysfunction).  You have cuts or sores on your feet that do not heal. Get help right away if:  Your arm or leg turns cold, numb, and blue.  Your arms or legs become red, warm, swollen, painful, or numb.  You have chest pain.  You have trouble breathing.  You suddenly have weakness in your face, arm, or leg.  You become very  confused or you cannot speak.  You suddenly have a very bad headache.  You suddenly cannot see. Summary  Peripheral vascular disease (PVD) is a disease of the blood vessels.  A simple term for PVD is poor circulation. Without treatment, PVD tends to get worse.  Treatment may include exercise, low fat and low cholesterol diet, and quitting smoking. This information is not intended to replace advice given to you by your health care provider. Make sure you discuss any questions you have with your health care provider. Document Revised: 03/04/2017 Document Reviewed: 04/29/2016 Elsevier Patient Education  2020 [employer]  

## 2020-02-08 NOTE — Assessment & Plan Note (Signed)
Recommend:  The patient has atypical pain symptoms for pure atherosclerotic disease. 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  Noninvasive studies including ABI's and venous ultrasound of the legs will be obtained and the patient will follow up with me to review these studies.  If the patient's noninvasive studies do not show significant vascular disease, suspect the patient is c/o pseudoclaudication.  Patient should have an evaluation of his LS spine which I defer to the primary service.  The patient should continue walking and begin a more formal exercise program. The patient should continue his antiplatelet therapy and aggressive treatment of the lipid abnormalities.  The patient should begin wearing graduated compression socks 15-20 mmHg strength to control edema.

## 2020-02-08 NOTE — Assessment & Plan Note (Signed)
I have had a long discussion with the patient regarding swelling and why it  causes symptoms.  Patient will begin wearing graduated compression stockings class 1 (20-30 mmHg) on a daily basis.  She already has these and has been doing this now for several months. The patient will  beginning wearing the stockings first thing in the morning and removing them in the evening. The patient is instructed specifically not to sleep in the stockings.   In addition, behavioral modification will be initiated.  This will include frequent elevation, use of over the counter pain medications and exercise such as walking.  I have reviewed systemic causes for chronic edema such as liver, kidney and cardiac etiologies.  The patient denies problems with these organ systems.    Consideration for a lymph pump will also be made based upon the effectiveness of conservative therapy.  This would help to improve the edema control and prevent sequela such as ulcers and infections   Patient should undergo duplex ultrasound of the venous system to ensure that DVT or reflux is not present.  The patient will follow-up with me after the ultrasound.

## 2020-03-11 ENCOUNTER — Ambulatory Visit (INDEPENDENT_AMBULATORY_CARE_PROVIDER_SITE_OTHER): Payer: Medicaid Other | Admitting: Vascular Surgery

## 2020-03-11 ENCOUNTER — Ambulatory Visit (INDEPENDENT_AMBULATORY_CARE_PROVIDER_SITE_OTHER): Payer: Medicaid Other

## 2020-03-11 ENCOUNTER — Other Ambulatory Visit: Payer: Self-pay

## 2020-03-11 DIAGNOSIS — M79604 Pain in right leg: Secondary | ICD-10-CM | POA: Diagnosis not present

## 2020-03-11 DIAGNOSIS — M7989 Other specified soft tissue disorders: Secondary | ICD-10-CM

## 2020-03-11 DIAGNOSIS — R600 Localized edema: Secondary | ICD-10-CM

## 2020-04-14 ENCOUNTER — Telehealth (INDEPENDENT_AMBULATORY_CARE_PROVIDER_SITE_OTHER): Payer: Self-pay | Admitting: Nurse Practitioner

## 2020-04-14 NOTE — Telephone Encounter (Signed)
Attempted to contact patient to discuss 03/11/2020 ultrasound results. LM to contact office

## 2020-07-13 ENCOUNTER — Observation Stay (HOSPITAL_COMMUNITY): Payer: Medicaid Other

## 2020-07-13 ENCOUNTER — Encounter (HOSPITAL_COMMUNITY): Payer: Self-pay

## 2020-07-13 ENCOUNTER — Emergency Department (HOSPITAL_COMMUNITY): Payer: Medicaid Other

## 2020-07-13 ENCOUNTER — Inpatient Hospital Stay (HOSPITAL_COMMUNITY)
Admission: EM | Admit: 2020-07-13 | Discharge: 2020-07-16 | DRG: 101 | Disposition: A | Discharge status: Home / Self Care | Payer: Medicaid Other | Attending: Family Medicine | Admitting: Family Medicine

## 2020-07-13 DIAGNOSIS — E878 Other disorders of electrolyte and fluid balance, not elsewhere classified: Secondary | ICD-10-CM | POA: Diagnosis not present

## 2020-07-13 DIAGNOSIS — F319 Bipolar disorder, unspecified: Secondary | ICD-10-CM | POA: Diagnosis present

## 2020-07-13 DIAGNOSIS — R030 Elevated blood-pressure reading, without diagnosis of hypertension: Secondary | ICD-10-CM | POA: Diagnosis present

## 2020-07-13 DIAGNOSIS — R739 Hyperglycemia, unspecified: Secondary | ICD-10-CM | POA: Diagnosis present

## 2020-07-13 DIAGNOSIS — Z20822 Contact with and (suspected) exposure to covid-19: Secondary | ICD-10-CM | POA: Diagnosis present

## 2020-07-13 DIAGNOSIS — S40862A Insect bite (nonvenomous) of left upper arm, initial encounter: Secondary | ICD-10-CM | POA: Diagnosis present

## 2020-07-13 DIAGNOSIS — R233 Spontaneous ecchymoses: Secondary | ICD-10-CM | POA: Diagnosis present

## 2020-07-13 DIAGNOSIS — Z91041 Radiographic dye allergy status: Secondary | ICD-10-CM

## 2020-07-13 DIAGNOSIS — R112 Nausea with vomiting, unspecified: Secondary | ICD-10-CM | POA: Diagnosis present

## 2020-07-13 DIAGNOSIS — G40909 Epilepsy, unspecified, not intractable, without status epilepticus: Principal | ICD-10-CM | POA: Diagnosis present

## 2020-07-13 DIAGNOSIS — K219 Gastro-esophageal reflux disease without esophagitis: Secondary | ICD-10-CM | POA: Diagnosis present

## 2020-07-13 DIAGNOSIS — W57XXXA Bitten or stung by nonvenomous insect and other nonvenomous arthropods, initial encounter: Secondary | ICD-10-CM | POA: Diagnosis present

## 2020-07-13 DIAGNOSIS — F17210 Nicotine dependence, cigarettes, uncomplicated: Secondary | ICD-10-CM | POA: Diagnosis present

## 2020-07-13 DIAGNOSIS — Z820 Family history of epilepsy and other diseases of the nervous system: Secondary | ICD-10-CM

## 2020-07-13 DIAGNOSIS — S40861A Insect bite (nonvenomous) of right upper arm, initial encounter: Secondary | ICD-10-CM | POA: Diagnosis present

## 2020-07-13 DIAGNOSIS — R569 Unspecified convulsions: Secondary | ICD-10-CM | POA: Diagnosis not present

## 2020-07-13 DIAGNOSIS — Z9049 Acquired absence of other specified parts of digestive tract: Secondary | ICD-10-CM

## 2020-07-13 DIAGNOSIS — H532 Diplopia: Secondary | ICD-10-CM | POA: Diagnosis present

## 2020-07-13 DIAGNOSIS — F1210 Cannabis abuse, uncomplicated: Secondary | ICD-10-CM | POA: Diagnosis present

## 2020-07-13 DIAGNOSIS — R29818 Other symptoms and signs involving the nervous system: Secondary | ICD-10-CM | POA: Diagnosis present

## 2020-07-13 LAB — CBC WITH DIFFERENTIAL/PLATELET
Abs Immature Granulocytes: 0.03 10*3/uL (ref 0.00–0.07)
Basophils Absolute: 0.1 10*3/uL (ref 0.0–0.1)
Basophils Relative: 1 %
Eosinophils Absolute: 0.2 10*3/uL (ref 0.0–0.5)
Eosinophils Relative: 2 %
HCT: 38.2 % (ref 36.0–46.0)
Hemoglobin: 12.7 g/dL (ref 12.0–15.0)
Immature Granulocytes: 0 %
Lymphocytes Relative: 36 %
Lymphs Abs: 3.4 10*3/uL (ref 0.7–4.0)
MCH: 33.2 pg (ref 26.0–34.0)
MCHC: 33.2 g/dL (ref 30.0–36.0)
MCV: 100 fL (ref 80.0–100.0)
Monocytes Absolute: 0.5 10*3/uL (ref 0.1–1.0)
Monocytes Relative: 6 %
Neutro Abs: 5.4 10*3/uL (ref 1.7–7.7)
Neutrophils Relative %: 55 %
Platelets: 230 10*3/uL (ref 150–400)
RBC: 3.82 MIL/uL — ABNORMAL LOW (ref 3.87–5.11)
RDW: 13 % (ref 11.5–15.5)
WBC: 9.6 10*3/uL (ref 4.0–10.5)
nRBC: 0 % (ref 0.0–0.2)

## 2020-07-13 LAB — COMPREHENSIVE METABOLIC PANEL
ALT: 11 U/L (ref 0–44)
AST: 22 U/L (ref 15–41)
Albumin: 3.2 g/dL — ABNORMAL LOW (ref 3.5–5.0)
Alkaline Phosphatase: 56 U/L (ref 38–126)
Anion gap: 7 (ref 5–15)
BUN: 8 mg/dL (ref 6–20)
CO2: 24 mmol/L (ref 22–32)
Calcium: 8.5 mg/dL — ABNORMAL LOW (ref 8.9–10.3)
Chloride: 112 mmol/L — ABNORMAL HIGH (ref 98–111)
Creatinine, Ser: 0.62 mg/dL (ref 0.44–1.00)
GFR, Estimated: >60 mL/min (ref >60)
Glucose, Bld: 84 mg/dL (ref 70–99)
Potassium: 3.8 mmol/L (ref 3.5–5.1)
Sodium: 143 mmol/L (ref 135–145)
Total Bilirubin: 0.5 mg/dL (ref 0.3–1.2)
Total Protein: 5.9 g/dL — ABNORMAL LOW (ref 6.5–8.1)

## 2020-07-13 LAB — CK: Total CK: 67 U/L (ref 38–234)

## 2020-07-13 LAB — RESP PANEL BY RT-PCR (FLU A&B, COVID) ARPGX2
Influenza A by PCR: NEGATIVE
Influenza B by PCR: NEGATIVE
SARS Coronavirus 2 by RT PCR: NEGATIVE

## 2020-07-13 LAB — ETHANOL: Alcohol, Ethyl (B): <10 mg/dL (ref <10)

## 2020-07-13 LAB — TROPONIN I (HIGH SENSITIVITY)
Troponin I (High Sensitivity): 3 ng/L (ref <18)
Troponin I (High Sensitivity): 3 ng/L (ref <18)

## 2020-07-13 LAB — HCG, QUANTITATIVE, PREGNANCY: hCG, Beta Chain, Quant, S: 2 m[IU]/mL (ref <5)

## 2020-07-13 LAB — PHOSPHORUS: Phosphorus: 4.5 mg/dL (ref 2.5–4.6)

## 2020-07-13 LAB — MAGNESIUM: Magnesium: 2 mg/dL (ref 1.7–2.4)

## 2020-07-13 MED ORDER — LORAZEPAM 2 MG/ML IJ SOLN
1.0000 mg | Freq: Once | INTRAMUSCULAR | Status: AC
  Administered 2020-07-13: 1 mg via INTRAVENOUS

## 2020-07-13 MED ORDER — NICOTINE 7 MG/24HR TD PT24
7.0000 mg | MEDICATED_PATCH | Freq: Every day | TRANSDERMAL | Status: DC
  Administered 2020-07-13 – 2020-07-16 (×4): 7 mg via TRANSDERMAL
  Filled 2020-07-13 (×4): qty 1

## 2020-07-13 MED ORDER — ENOXAPARIN SODIUM 40 MG/0.4ML ~~LOC~~ SOLN
40.0000 mg | SUBCUTANEOUS | Status: DC
  Administered 2020-07-13 – 2020-07-15 (×3): 40 mg via SUBCUTANEOUS
  Filled 2020-07-13 (×3): qty 0.4

## 2020-07-13 MED ORDER — SODIUM CHLORIDE 0.9% FLUSH: 3.0000 mL | INTRAVENOUS | Status: DC | PRN

## 2020-07-13 MED ORDER — DEXTROSE 5 % IV SOLN
640.0000 mg | Freq: Three times a day (TID) | INTRAVENOUS | Status: DC
  Administered 2020-07-14 – 2020-07-16 (×7): 640 mg via INTRAVENOUS
  Filled 2020-07-13 (×9): qty 12.8
  Filled 2020-07-13: qty 10
  Filled 2020-07-13 (×2): qty 12.8

## 2020-07-13 MED ORDER — POLYETHYLENE GLYCOL 3350 17 G PO PACK: 17.0000 g | PACK | Freq: Every day | ORAL | Status: DC | PRN

## 2020-07-13 MED ORDER — FENTANYL CITRATE (PF) 100 MCG/2ML IJ SOLN
100.0000 ug | Freq: Once | INTRAMUSCULAR | Status: AC
  Administered 2020-07-13: 100 ug via INTRAVENOUS
  Filled 2020-07-13: qty 2

## 2020-07-13 MED ORDER — SODIUM CHLORIDE 0.9 % IV SOLN: 250.0000 mL | INTRAVENOUS | Status: DC | PRN

## 2020-07-13 MED ORDER — SODIUM CHLORIDE 0.9% FLUSH
3.0000 mL | Freq: Two times a day (BID) | INTRAVENOUS | Status: DC
  Administered 2020-07-13 – 2020-07-15 (×4): 3 mL via INTRAVENOUS

## 2020-07-13 MED ORDER — SODIUM CHLORIDE 0.9 % IV SOLN: INTRAVENOUS | Status: DC

## 2020-07-13 MED ORDER — PANTOPRAZOLE SODIUM 20 MG PO TBEC
20.0000 mg | DELAYED_RELEASE_TABLET | Freq: Every day | ORAL | Status: DC
  Administered 2020-07-14 – 2020-07-16 (×4): 20 mg via ORAL
  Filled 2020-07-13 (×4): qty 1

## 2020-07-13 MED ORDER — ONDANSETRON HCL 4 MG/2ML IJ SOLN
4.0000 mg | Freq: Once | INTRAMUSCULAR | Status: AC
  Administered 2020-07-13: 4 mg via INTRAVENOUS
  Filled 2020-07-13: qty 2

## 2020-07-13 MED ORDER — LORAZEPAM 2 MG/ML IJ SOLN
0.5000 mg | Freq: Once | INTRAMUSCULAR | Status: DC
  Filled 2020-07-13: qty 1

## 2020-07-13 NOTE — Progress Notes (Signed)
Pharmacy Antibiotic Note  Bonnie Stone is a 45 y.o. 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  Pharmacy has been consulted for Acyclovir dosing.  Plan: Acyclovir 640 mg IV q8h     Temp (24hrs), Avg:98.4 F (36.9 C), Min:98.4 F (36.9 C), Max:98.4 F (36.9 C)  Recent Labs  Lab 07/13/20 1910  WBC 9.6  CREATININE 0.62    CrCl cannot be calculated (Unknown ideal weight.).    Allergies  Allergen Reactions  . Bee Venom Anaphylaxis  . Penicillins     Eddie Candle 07/13/2020 11:06 PM

## 2020-07-13 NOTE — Hospital Course (Addendum)
Bonnie Stone is a 45 y.o. female presenting with witnessed seizure activity lasting 45 seconds. No PMHx.   Witnessed seizure-like activity [name] presented to the ED via EMS after witnessed seizure-like activity approximately 45 seconds in length. Daughter witnessed and reported to ED providers that [name]'s eyes rolled back along with movement of her arms and legs. She experienced several minutes of post-ictal state with quick return to baseline without focal deficit. On admission, vital signs were stable. EKG and CT head unremarkable. CBC, CMP largely within normal limits with the following exceptions; glucose 112, albumin 3.2, total protein 5.9, calcium 8.5. MRI brain demonstrated no acute intracranial abnormality with multifocal hyperintense T2-weighted signal within the brainstem, which may be a sequela of chronic small vessel ischemia. Neurology consulted, ordered EEG which showed no epileptiform discharges and generalized slowing. Lumbar puncture successfully performed without complications. LP normal, did not denote any viral or bacterial etiology. CSF culture demonstrated no growth for 24 hours. She was started on Keppra. UDS positive only for THC, serum ETOH <10. Patient without seizure activity during her hospital stay. Patient discharged on with close PCP and [facility] Neurology follow up.   Elevated blood pressures BP on presentation 140/99. Systolics since range 120-160s. Likely dependent on patient movement. No PMHx hypertension. Patient high blood pressures resolved soon after admission remained normotensive. Patient discharged home without antihypertensive medication, will  recommend PCP follow up.   Hyperemesis  Noted to have multiple episodes of nausea and vomiting throughout hospitalization. Given zofran which resolved her symptoms, likely secondary to [facility] use. No further intervention performed.   Issues for follow up:  Patient would likely benefit from therapy or  counseling.  Encourage cessation of THC as this likely caused her symptoms  Recheck BP to ensure patient does not need anti-hypertensive therapy as was elevated on admission.   Ensure patient follows up with neurology outpatient.  Ensure patient remains compliant on anti-epileptic therapy.  Ensure patient does not drive for at least 6 months following discharge given recent seizure.

## 2020-07-13 NOTE — H&P (Addendum)
Family Medicine Teaching [facility] Admission History and Physical Service Pager: [phone]  Patient name: Bonnie Stone Medical record number: [id number] Date of birth: [birth year] Age: 45 y.o. Gender: female  Primary Care Provider: Lonie Peak, PA-C Consultants: Neuro Code Status: Full code Preferred Emergency Contact: daughter Dresbach.  [phone]  Chief Complaint: seizure  Assessment and Plan: Bonnie Stone is a 45 y.o. female presenting with witnessed seizure activity lasting 45 seconds. No PMHx.   Witnessed seizure-like activity Patient presented via EMS for witnessed seizure, first one in her life. Appears to be generalized tonic-clonic seizure by description. Patient reports preceding two weeks developing right sided dull headaches and vision changes (has needed to increase the font size on her phone quite a bit recently). She was at the grocery store with her daughter and grandchild this afternoon when she developed a dull right sided headache; in the parking lot, she had a seizure. Daughter witnessed, caught patient and helped her to ground. Daughter reported to ED provider that patient's arms and legs were shaking and her eyes rolled back into her head. Whole seizure lasted approximately 45 seconds. Post-ictal confusion for several minutes with return to baseline. No residual slurring, weakness. Patient denies head injury, biting tongue, or incontinence. Patient has never had a seizure before and has no medical diagnoses. Of note, her 9 yo son has epilepsy with no seizure in 6 years. Patient does not take any medicines. Uses marijuana and alcohol approx 1 week a month and smokes cigarettes daily. Patient does admit to previous history of illicit drug use; says she previously used cocaine but stopped 14 years ago after the conception of her youngest child. Patient denies current illicit drug use. Vital signs stable, blood pressure elevated. EKG NSR with no abnormalities. CBC  with differential normal. CMP unremarkable aside from glucose 112, Ca 8.5, albumin 3.2, and total protein 5.9. Serum ETOH <10. CT head unremarkable. Differential diagnoses for this new seizure includes unprovoked seizure, structural brain abnormality,substance withdrawal, or illicit drug use.   - Admit to med surg with Dr. McDiarmid attending - Neuro consulted, appreciate ongoing care - Vitals and pulse ox per unit routine - Awaiting EEG - Awaiting UDS, UA - Awaiting MRI head - F/u AM CBC, CMP, TSH, A1c - Seizure precautions  Elevated blood pressures BP on presentation 140/99. Systolics since range 120-160s. Likely dependent on patient movement. No PMHx hypertension. Will continue to monitor.  - Vitals per unit routine  Elevated blood glucose Glucose on admission CMP 112. No PMHx diabetes or pre-diabetes. Will continue to monitor.  - AM CMP, A1c  Acid reflux Patient reports she takes over the counter prevacid for "pretty bad reflux". Will give protonix while inpatient.   Substance abuse Pt is 1/2 ppd user for over 30 years. Rare alcohol use.  Current marijuana use, but not daily.  Also admits to history of cocaine use until 14 years ago.  - nicotine replacement therapy - f/u UDS - consider SW consult for substance use resources pending UDS results.   FEN/GI: Regular Prophylaxis: Lovenox  Disposition: Med surg  History of Present Illness:  Bonnie Stone is a 45 y.o. female presenting with first time seizure, witnessed.   Pt was in the checkout line at the grocery store earlier today.  She complained of right sided headache.  In the parking lot she told her daughter 'something's not right'. Then next thing she remembers is waking up in the parking lot. Pt states her daughter told her that  her eyes rolled back in her head and her arms and legs started shaking.  She was helped to the ground.  pdaughter states she was incoherent and asking for her deceased mother when she regained  consciousness. She has no prior history of seizures. She did not feel ill prior to this.  Every once in a while complains of dull right sided headache.  She has had worsening near vision but otherwise no vision changes starting two weeks ago. She has been smelling an 'ammonia smell' when waking up and other occasions.  She describes it as 'cat piss'.  These are transient. She has paresthesia of the feet bilaterally at baseline due to 'poor circulation'.  No weakness of the extremities.   She has had cholecystectomy, BTL.  Otherwise no medical issues.    She takes prevacid for acid reflux.  No other medications.    She has one son with febrile seizures and one with epilepsy (46yo, not currently on medications).  No other family members with seizure history.   She smokes 1/2 ppd for 33 years.  Denies alcohol use.  Hasn't used 'hardcore drugs' in 14 years. Prior cocaine use.  Admits to smokiing marijuana.  This is usually when husband comes home, which is one week a month.    Review Of Systems: Per HPI with the following additions:   Review of Systems  Constitutional: Negative for fatigue and fever.  HENT: Negative for congestion and sinus pain.   Eyes: Positive for visual disturbance.  Respiratory: Negative for cough and shortness of breath.   Cardiovascular: Negative for chest pain.  Gastrointestinal: Negative for constipation, diarrhea, nausea and vomiting.  Neurological: Positive for headaches.     Patient Active Problem List   Diagnosis Date Noted  . Seizure (HCC) 07/13/2020  . Pain in limb 02/08/2020  . Swelling of limb 02/08/2020    Past Medical History: History reviewed. No pertinent past medical history.  Past Surgical History: Past Surgical History:  Procedure Laterality Date  . CHOLECYSTECTOMY    . TUBAL LIGATION      Social History: Social History   Tobacco Use  . Smoking status: Current Some Day Smoker    Packs/day: 0.50    Years: 23.00    Pack years: 11.50     Types: Cigarettes  . Smokeless tobacco: Never Used  . Tobacco comment: Smoking since age of 45 years old  Substance Use Topics  . Alcohol use: Yes    Comment: Drinks once a month with husband  . Drug use: Not Currently    Types: Cocaine    Comment: Reports last use 2008 after conception of last child   Additional social history: None  Please also refer to relevant sections of EMR.  Family History: Family History  Problem Relation Age of Onset  . Seizures Son        Epilepsy  . Seizures Son        Febrile seizures    Allergies and Medications: Allergies  Allergen Reactions  . Bee Venom Anaphylaxis  . Penicillins    No current facility-administered medications on file prior to encounter.   Current Outpatient Medications on File Prior to Encounter  Medication Sig Dispense Refill  . diphenhydrAMINE (BENADRYL ALLERGY) 25 MG tablet Take 1 tablet (25 mg total) by mouth every 6 (six) hours as needed. (Patient not taking: Reported on 02/08/2020) 30 tablet 0  . EPINEPHrine 0.3 mg/0.3 mL IJ SOAJ injection Inject 0.3 mLs (0.3 mg total) into the muscle as  needed for anaphylaxis. 1 each 1  . ondansetron (ZOFRAN ODT) 4 MG disintegrating tablet Allow 1-2 tablets to dissolve in your mouth every 8 hours as needed for nausea/vomiting (Patient not taking: Reported on 02/08/2020) 30 tablet 0  . potassium chloride SA (KLOR-CON M20) 20 MEQ tablet Take 1 tablet (20 mEq total) by mouth daily. (Patient not taking: Reported on 02/08/2020) 7 tablet 0  . predniSONE (DELTASONE) 50 MG tablet Take 1 tablet (50 mg total) by mouth daily with breakfast. (Patient not taking: Reported on 02/08/2020) 5 tablet 0    Objective: BP 121/75   Pulse 79   Temp 98.4 F (36.9 C) (Oral)   Resp (!) 31   LMP 03/16/2017 (Approximate)   SpO2 97%  Exam: General: awake, alert, oriented, no acute distress Eyes: PERRL, EOM intact ENTM: Moist mucous membranes, full top and bottom dentures Cardiovascular: RRR, no  murmur Respiratory: CTAB Gastrointestinal: flat non-distended abdomen, no TTP MSK: Moving all extremities spontaneously, strength equal, no focal deficits Derm: discrete erythematous macules over right eyebrow and bilateral forearms in different stages of healing Neuro: cranial nerves II-X intact, even shoulder shrug, equal grip strength, BUE strength 5/5 and equal, BLE strength 5/5 and equal, heel-to-shin intact bilaterally Psych: appears to have normal insight and judgement  Labs and Imaging: CBC BMET  Recent Labs  Lab 07/13/20 1910  WBC 9.6  HGB 12.7  HCT 38.2  PLT 230   Recent Labs  Lab 07/13/20 1910  NA 143  K 3.8  CL 112*  CO2 24  BUN 8  CREATININE 0.62  GLUCOSE 84  CALCIUM 8.5*     EKG: NSR, normal R wave progression, no ST segment elevation  CT HEAD WITHOUT CONTRAST 07/13/2020 TECHNIQUE: Contiguous axial images were obtained from the base of the skull through the vertex without intravenous contrast. COMPARISON:  04/20/2017 FINDINGS: Brain: There is no mass, hemorrhage or extra-axial collection. The size and configuration of the ventricles and extra-axial CSF spaces are normal. The brain parenchyma is normal, without acute or chronic infarction. Vascular: No abnormal hyperdensity of the major intracranial arteries or dural venous sinuses. No intracranial atherosclerosis. Skull: The visualized skull base, calvarium and extracranial soft tissues are normal. Sinuses/Orbits: No fluid levels or advanced mucosal thickening of the visualized paranasal sinuses. No mastoid or middle ear effusion. The orbits are normal. IMPRESSION: Normal head CT.   Fayette Pho, MD 07/13/2020, 9:39 PM PGY-1, [facility] FPTS Intern pager: [phone], text pages welcome  Resident Addendum I have separately seen and examined the patient.  I have discussed the findings and exam with the resident and agree with the above note.  I helped develop the management plan  that is described in the student's note and I agree with the content.     Lenor Coffin, MD PGY-3 [facility] residency program

## 2020-07-13 NOTE — Consult Note (Signed)
Neurology Consultation Reason for Consult: Seizure-like activity Requesting Physician: Todd McDiarmid  CC: Seizure  History is obtained from: Patient, daughter at bedside, chart review  HPI: Bonnie Stone is a 45 y.o. female with a PMHx signifcant for shingles (2018 clinical diagnosis only), remote substance use, intermittent marijuana use, ongoing tobacco and occasional alcohol use presenting with event concerning for seizure.  She has been in her normal state of health but this morning woke up noting a strong ammonia smell like a cat litter box.  She then had a severe right-sided headache while at a grocery shopping with her daughter.  Shortly after the onset of the headache she had versive head turn to the right followed by generalized tonic-clonic activity lasting for 45 seconds with postevent confusion and slow normalization of her mental status over approximately 8 minutes.  She continues to have a headache and have some emotional lability, she remembers the headache and feeling unwell but does not remember the actual seizure episode.  Associated with this headache she has had some nausea but no light or sound sensitivity, and no emesis.  She did not have urinary incontinence, bowel incontinence or tongue bites with this episode.  She has continued to have intermittent strong ammonia smell.  She notes she had a remote history of migraines with the last headache being over 2 years ago.  She additionally has some monocular double vision which she reports to me is in both of her eyes when she covers either eye and does not improve with administration of eyedrops.  It has been going on 6 to 8 months and has been stable.  She additionally reports some gassy chest pain for the last week on the left side of her chest, and night sweats for the past 3 years that have been increasing in intensity recently.  Additionally she notes some exposure to fleas and having flea bites on her arms from a family  member's home.  She reports her marijuana use is purchased from the streets and helps her manage her emotions when her husband is home.  Last use was approximately 1 week ago.  ROS: All other review of systems was negative except as noted in the HPI.   History reviewed. No pertinent past medical history.  Past Surgical History:  Procedure Laterality Date  . CHOLECYSTECTOMY    . TUBAL LIGATION     Current Outpatient Medications  Medication Instructions  . diphenhydrAMINE (BENADRYL ALLERGY) 25 mg, Oral, Every 6 hours PRN  . EPINEPHrine (EPI-PEN) 0.3 mg, Intramuscular, As needed  . ondansetron (ZOFRAN ODT) 4 MG disintegrating tablet Allow 1-2 tablets to dissolve in your mouth every 8 hours as needed for nausea/vomiting  . potassium chloride SA (KLOR-CON M20) 20 MEQ tablet 20 mEq, Oral, Daily  . predniSONE (DELTASONE) 50 mg, Oral, Daily with breakfast   Family History  Problem Relation Age of Onset  . Seizures Son        Epilepsy  . Seizures Son        Febrile seizures  Son's name is Zaire Vanbuskirk and he is approximately 46 years old; she is unsure of the type of seizures he has, per limited notes in chart review he was seen by Dr. Sharene Skeans and was managed on carbamazepine for some time "The patient is a 17-year-old who had epilepsy beginning at age 69-1/2.  These were associated with both convulsive and nonconvulsive seizures, the last occurred 2 years ago." 11/2010 --patient reports that her son has no longer on  antiseizure medications and has remained seizure-free.  Social History:  reports that she has been smoking cigarettes. She has a 11.50 pack-year smoking history. She has never used smokeless tobacco. She reports current alcohol use. She reports previous drug use. Drug: Cocaine.  Exam: Current vital signs: BP 138/83   Pulse 69   Temp 98.4 F (36.9 C) (Oral)   Resp 17   LMP 03/16/2017 (Approximate)   SpO2 98%  Vital signs in last 24 hours: Temp:  [98.4 F (36.9 C)] 98.4 F  (36.9 C) (04/10 1819) Pulse Rate:  [69-79] 69 (04/10 2156) Resp:  [17-31] 17 (04/10 2156) BP: (121-140)/(75-99) 138/83 (04/10 2156) SpO2:  [97 %-100 %] 98 % (04/10 2156)   Physical Exam  Constitutional: Appears well-developed and well-nourished.  Psych: Affect fairly labile. Eyes: No scleral injection HENT: No oropharyngeal obstruction.  Slight discomfort with neck flexion, discomfort with head turn to the left MSK: no joint deformities.  Cardiovascular: Normal rate and regular rhythm.  Respiratory: Effort normal, non-labored breathing GI: Soft.  No distension. There is no tenderness.  Skin: Erythematous slight papular rash around the right orbit, not clearly vesicular.  Scattered bites on the arms and legs which are reported to be fleabites  Neuro: Mental Status: Patient is awake, alert, oriented to person, place, month, year, and situation. Patient is able to give a clear and coherent history apart from the actual seizure event, for which the history is recounted by the daughter who was present. No signs of aphasia or neglect Cranial Nerves: II: Visual Fields are full. Pupils are equal, round, and reactive to light.  3 to 1 mm III,IV, VI: EOMI without ptosis.  There is diplopia that is vertical monocular in the left eye V: Facial sensation is reduced in V1 and V3 to temperature and light touch on the right side face VII: Facial movement is symmetric.  VIII: hearing is reduced on Weber testing to the right ear X: Uvula elevates symmetrically XI: Shoulder shrug is symmetric. XII: tongue is midline without atrophy or fasciculations.  Motor: Tone is normal. Bulk is normal. 5/5 strength was present in all four extremities, other than mild right-sided leg weakness 4/5 throughout the right leg, not baseline per patient Sensory: Sensation is symmetric to light touch and temperature in the arms and legs. Deep Tendon Reflexes: 2+ and symmetric in the biceps and patellae.   Plantars: Toes are downgoing bilaterally.  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  Head turn to the right would match the left potential petechial hemorrhage with an commendation with the new periorbital rash and history of shingles in the past is highly concerning for potential herpetic encephalitis/meningitis.  Unfortunately the patient reports a contrast allergy requiring treatment, for which records are not available and she is uncertain if this was iodinated or gadolinium allergy.  We will therefore proceed with a noncontrast MRI  Recommendations: -MRI brain without  contrast, stat -Lumbar puncture with cell counts and tubes 1 and 4, protein, glucose, HSV PCR, VZV PCR, VZV CSF IgG -Routine EEG -Keppra 500 mg twice daily -Empiric acyclovir with fluid boluses for renal protection pending LP and MRI results -Ativan 1 mg anxiolytic for MRI brain -Neurology will continue to follow  Brooke Dare MD-PhD [facility] [phone] Available 7 PM to 7 AM, outside of these hours please call Neurologist on call as listed on Amion.

## 2020-07-13 NOTE — Progress Notes (Signed)
Spoke with daughter Tommie Ard pt was getting MRI.  She confirms patient's description of events.  States that patient may have had a prior seizure episode 4 years ago in the back of the daughter's car.  She stated the patient briefly had deviation of her eyes with jerking of one of her upper extremities for less than 30 seconds.  She was taken to the hospital but not admitted.  Daughter states they were told symptoms were due to dehydration.    Daughter states that the rash over patient's right eye appeared after arrival at hospital.  She states the rash on her arms are due to flea bites.

## 2020-07-13 NOTE — ED Provider Notes (Signed)
[facility]   CSN: [id number] Arrival date & time: 07/13/20  1810     History Chief Complaint  Patient presents with  . Seizures    Bonnie Stone is a 45 y.o. female presents with EMS for witnessed seizure activity.  She states watching depression shopping she developed frontotemporal right-sided headache a significant severe.  Within the next few minutes headache became extremely painful and she "did not feel right".  Patient's daughter is at the bedside, who witnessed the entire incident.  A walking to the car when the car.  Patient said she did not feel right and lost consciousness.  The patient's daughter caught her and lowered her to the ground the patient began to have whole body shaking and her eyes were rolling back in her head.  According to her daughter this lasted for approximately 45 seconds.  There was approximately 10 seconds where she does not believe, that was breathing.  She states that her mother lay unconscious on the concrete breathing on her own for approximately 3 minutes.  She then regained consciousness but was confused for approximately 5 additional minutes before getting normally.  At this time the patient endorses severe right frontotemporal headache but otherwise has no concerns  Patient has never had a seizure in the past though she does have a son with epilepsy.  She is not on any medications every day, denies any new medications.  She denies any alcohol resistant states she may have some marijuana present but otherwise denies any illicit drugs.  I personally reviewed the patient's records.   HPI     History reviewed. No pertinent past medical history.  Patient Active Problem List   Diagnosis Date Noted  . Seizure (HCC) 07/13/2020  . Pain in limb 02/08/2020  . Swelling of limb 02/08/2020    Past Surgical History:  Procedure Laterality Date  . CHOLECYSTECTOMY    . TUBAL LIGATION       OB  History   No obstetric history on file.     Family History  Problem Relation Age of Onset  . Seizures Son        Epilepsy  . Seizures Son        Febrile seizures    Social History   Tobacco Use  . Smoking status: Current Some Day Smoker    Packs/day: 0.50    Years: 23.00    Pack years: 11.50    Types: Cigarettes  . Smokeless tobacco: Never Used  . Tobacco comment: Smoking since age of 45 years old  Substance Use Topics  . Alcohol use: Yes    Comment: Drinks once a month with husband  . Drug use: Not Currently    Types: Cocaine    Comment: Reports last use 2008 after conception of last child    Home Medications Prior to Admission medications   Medication Sig Start Date End Date Taking? Authorizing Provider  diphenhydrAMINE (BENADRYL ALLERGY) 25 MG tablet Take 1 tablet (25 mg total) by mouth every 6 (six) hours as needed. Patient not taking: Reported on 02/08/2020 11/04/19   Jene EveryKinner, Robert, MD  EPINEPHrine 0.3 mg/0.3 mL IJ SOAJ injection Inject 0.3 mLs (0.3 mg total) into the muscle as needed for anaphylaxis. 11/04/19   Jene EveryKinner, Robert, MD  ondansetron (ZOFRAN ODT) 4 MG disintegrating tablet Allow 1-2 tablets to dissolve in your mouth every 8 hours as needed for nausea/vomiting Patient not taking: Reported on 02/08/2020 04/21/17   Loleta RoseForbach, Cory,  MD  potassium chloride SA (KLOR-CON M20) 20 MEQ tablet Take 1 tablet (20 mEq total) by mouth daily. Patient not taking: Reported on 02/08/2020 04/21/17   Loleta Rose, MD  predniSONE (DELTASONE) 50 MG tablet Take 1 tablet (50 mg total) by mouth daily with breakfast. Patient not taking: Reported on 02/08/2020 11/04/19   Jene Every, MD    Allergies    Bee venom and Penicillins  Review of Systems   Review of Systems  Constitutional: Negative.   HENT: Negative.   Eyes: Positive for visual disturbance. Negative for photophobia, pain, redness and itching.  Respiratory: Negative.   Cardiovascular: Negative.   Gastrointestinal: Positive  for nausea.  Endocrine: Negative.   Musculoskeletal: Negative.   Neurological: Positive for seizures and headaches.  Hematological: Negative.   Psychiatric/Behavioral: Negative.     Physical Exam Updated Vital Signs BP (!) 128/91   Pulse 71   Temp 98.4 F (36.9 C) (Oral)   Resp (!) 22   LMP 03/16/2017 (Approximate)   SpO2 100%   Physical Exam Vitals and nursing note reviewed.  Constitutional:      Appearance: Normal appearance. She is normal weight.  HENT:     Head: Normocephalic and atraumatic.     Nose: Nose normal.     Mouth/Throat:     Mouth: Mucous membranes are moist.     Pharynx: Oropharynx is clear. Uvula midline. No oropharyngeal exudate or posterior oropharyngeal erythema.  Eyes:     General: Lids are normal. Vision grossly intact.        Right eye: No discharge.        Left eye: No discharge.     Extraocular Movements: Extraocular movements intact.     Conjunctiva/sclera: Conjunctivae normal.     Pupils: Pupils are equal, round, and reactive to light.  Neck:     Trachea: Trachea and phonation normal.  Cardiovascular:     Rate and Rhythm: Normal rate and regular rhythm.     Pulses: Normal pulses.     Heart sounds: Normal heart sounds. No murmur heard.   Pulmonary:     Effort: Pulmonary effort is normal. No tachypnea, bradypnea or respiratory distress.     Breath sounds: Normal breath sounds. No wheezing or rales.  Chest:     Chest wall: No mass, lacerations, deformity, swelling, tenderness, crepitus or edema.  Abdominal:     General: Bowel sounds are normal. There is no distension.     Palpations: Abdomen is soft.     Tenderness: There is no abdominal tenderness. There is no guarding or rebound.  Musculoskeletal:        General: No deformity.     Cervical back: Full passive range of motion without pain and neck supple. No edema, rigidity or crepitus. No pain with movement, spinous process tenderness or muscular tenderness.     Right lower leg: No  edema.     Left lower leg: No edema.  Lymphadenopathy:     Cervical: No cervical adenopathy.  Skin:    General: Skin is warm and dry.     Capillary Refill: Capillary refill takes less than 2 seconds.     Findings: No rash.  Neurological:     General: No focal deficit present.     Mental Status: She is alert and oriented to person, place, and time. Mental status is at baseline.     Cranial Nerves: Cranial nerves are intact.     Sensory: Sensory deficit present.     Motor: Motor  function is intact.     Coordination: Coordination is intact.     Gait: Gait is intact.     Deep Tendon Reflexes: Reflexes are normal and symmetric.     Comments: Decreased sensation on the right face  Psychiatric:        Mood and Affect: Mood normal.     ED Results / Procedures / Treatments   Labs (all labs ordered are listed, but only abnormal results are displayed) Labs Reviewed  COMPREHENSIVE METABOLIC PANEL - Abnormal; Notable for the following components:      Result Value   Chloride 112 (*)    Calcium 8.5 (*)    Total Protein 5.9 (*)    Albumin 3.2 (*)    All other components within normal limits  CBC WITH DIFFERENTIAL/PLATELET - Abnormal; Notable for the following components:   RBC 3.82 (*)    All other components within normal limits  RESP PANEL BY RT-PCR (FLU A&B, COVID) ARPGX2  CSF CULTURE W GRAM STAIN  ETHANOL  MAGNESIUM  HCG, QUANTITATIVE, PREGNANCY  CK  PHOSPHORUS  URINALYSIS, ROUTINE W REFLEX MICROSCOPIC  RAPID URINE DRUG SCREEN, HOSP PERFORMED  HIV ANTIBODY (ROUTINE TESTING W REFLEX)  HEMOGLOBIN A1C  TSH  COMPREHENSIVE METABOLIC PANEL  CBC  CSF CELL COUNT WITH DIFFERENTIAL  PROTEIN AND GLUCOSE, CSF  HERPES SIMPLEX VIRUS(HSV) DNA BY PCR  VARICELLA-ZOSTER BY PCR  VARICELLA ZOSTER ANTIBODY, IGG  CBG MONITORING, ED  TROPONIN I (HIGH SENSITIVITY)  TROPONIN I (HIGH SENSITIVITY)    EKG EKG Interpretation  Date/Time:  Sunday July 13 2020 18:58:02 EDT Ventricular Rate:   70 PR Interval:  130 QRS Duration: 92 QT Interval:  373 QTC Calculation: 403 R Axis:   53 Text Interpretation: Sinus rhythm since last tracing no significant change Confirmed by Mancel Bale [phone]) on 07/13/2020 7:29:16 PM   Radiology CT Head Wo Contrast  Result Date: 07/13/2020 CLINICAL DATA:  Seizure EXAM: CT HEAD WITHOUT CONTRAST TECHNIQUE: Contiguous axial images were obtained from the base of the skull through the vertex without intravenous contrast. COMPARISON:  04/20/2017 FINDINGS: Brain: There is no mass, hemorrhage or extra-axial collection. The size and configuration of the ventricles and extra-axial CSF spaces are normal. The brain parenchyma is normal, without acute or chronic infarction. Vascular: No abnormal hyperdensity of the major intracranial arteries or dural venous sinuses. No intracranial atherosclerosis. Skull: The visualized skull base, calvarium and extracranial soft tissues are normal. Sinuses/Orbits: No fluid levels or advanced mucosal thickening of the visualized paranasal sinuses. No mastoid or middle ear effusion. The orbits are normal. IMPRESSION: Normal head CT. Electronically Signed   By: Deatra Robinson M.D.   On: 07/13/2020 19:52   MR BRAIN WO CONTRAST  Result Date: 07/14/2020 CLINICAL DATA:  Seizure EXAM: MRI HEAD WITHOUT CONTRAST TECHNIQUE: Multiplanar, multiecho pulse sequences of the brain and surrounding structures were obtained without intravenous contrast. COMPARISON:  None. FINDINGS: Brain: No acute infarct, mass effect or extra-axial collection. No acute or chronic hemorrhage. There is multifocal hyperintense T2-weighted signal within the brainstem. Parenchymal volume and CSF spaces are normal. The midline structures are normal. Vascular: Major flow voids are preserved. Skull and upper cervical spine: Normal calvarium and skull base. Visualized upper cervical spine and soft tissues are normal. Sinuses/Orbits:No paranasal sinus fluid levels or advanced  mucosal thickening. No mastoid or middle ear effusion. Normal orbits. IMPRESSION: 1. No acute intracranial abnormality. 2. Multifocal hyperintense T2-weighted signal within the brainstem, which may be a sequela of chronic small vessel ischemia. Electronically  Signed   By: Deatra Robinson M.D.   On: 07/14/2020 00:02    Procedures Procedures   Medications Ordered in ED Medications  enoxaparin (LOVENOX) injection 40 mg (40 mg Subcutaneous Given 07/13/20 2155)  sodium chloride flush (NS) 0.9 % injection 3 mL (3 mLs Intravenous Given 07/13/20 2157)  sodium chloride flush (NS) 0.9 % injection 3 mL (has no administration in time range)  0.9 %  sodium chloride infusion (has no administration in time range)  polyethylene glycol (MIRALAX / GLYCOLAX) packet 17 g (has no administration in time range)  nicotine (NICODERM CQ - dosed in mg/24 hr) patch 7 mg (7 mg Transdermal Patch Applied 07/13/20 2154)  pantoprazole (PROTONIX) EC tablet 20 mg (20 mg Oral Given 07/14/20 0025)  acyclovir (ZOVIRAX) 640 mg in dextrose 5 % 100 mL IVPB (0 mg Intravenous Stopped 07/14/20 0124)  0.9 %  sodium chloride infusion ( Intravenous New Bag/Given 07/14/20 0123)  acetaminophen (TYLENOL) tablet 1,000 mg (has no administration in time range)  ondansetron (ZOFRAN) injection 4 mg (4 mg Intravenous Given 07/13/20 1951)  fentaNYL (SUBLIMAZE) injection 100 mcg (100 mcg Intravenous Given 07/13/20 1951)  LORazepam (ATIVAN) injection 1 mg (1 mg Intravenous Given 07/13/20 2306)  ketorolac (TORADOL) 30 MG/ML injection 30 mg (30 mg Intravenous Given 07/14/20 0026)    ED Course  I have reviewed the triage vital signs and the nursing notes.  Pertinent labs & imaging results that were available during my care of the patient were reviewed by me and considered in my medical decision making (see chart for details).  Clinical Course as of 07/14/20 0126  Sun Jul 13, 2020  2010 Consult to neurology, Dr. Iver Nestle, who recommends admission to medicine  service with inpatient neurology consultation for completed usual work-up.  Appreciate collaboration of care this patient.  No anticonvulsant medications to be administered at this time, per neurology.  [RS]  2013 Consult to internal medicine, Dr. Larita Fife, who is agreeable to seeing this patient and admitting her to their service. I appreciate her collaboration in the care of this patient.  [RS]    Clinical Course User Index [RS] Beva Remund, Idelia Salm   MDM Rules/Calculators/A&P                         45 year old female presents with concern for seizure episode this afternoon.  First-time seizure.  Differential diagnosis for this patient symptoms includes but is not limited to epilepsy, hyponatremia or other metabolic derangement, meningitis, intracerebral abscess, intracranial hemorrhage, drug withdrawal or intoxication, hypertension, hydrocephalus, intracranial mass, syncope, migraine.  Hypertensive on intake, vital signs otherwise normal.  Cardiopulmonary exam is normal, abdominal exam is benign.  Neurologic exam is significant only for decreased sensation in the right half of the face; otherwise without focal deficit.  CBC without leukocytosis, CMP unremarkable.  Magnesium is normal, patient is not pregnant, negative alcohol level.  Negative troponin.  CT of the head was negative as well.  Patient began to experience severe headache in the right frontotemporal area, similar to what she experienced prior to her seizure episode earlier.  Analgesia and antiemetic offered.  Consult was placed to neurology and internal medicine as above.  Will admit the patient to hospital for completion of her work-up.  Ines voiced understanding of her medical evaluation and treatment plan.  Each of her questions was answered to her expressed satisfaction. She is amenable to plan for admission. Patient is hemodynamically stable at this time.  This chart  was dictated using voice recognition software, Dragon.  Despite the best efforts of this provider to proofread and correct errors, errors may still occur which can change documentation meaning.  Final Clinical Impression(s) / ED Diagnoses Final diagnoses:  None    Rx / DC Orders ED Discharge Orders    None       Sherrilee Gilles 07/14/20 0126    Mancel Bale, MD 07/14/20 [phone]

## 2020-07-13 NOTE — ED Triage Notes (Addendum)
Pt bibems from [facility]. Pt had a dull pain to the right side of her head that progressed as she was walking outside. She had a witnessed seizure lasting about 45 seconds with full body involvement. She was lowered to the ground by family. Pt had a 3 min post ictal period. Pt was alert and oriented x4 when ems arrived. Pt still complaining of the head pain that comes and goes, states it goes down her neck and behind her ear. Pt has a hx of 2 small clots in her R leg.

## 2020-07-14 ENCOUNTER — Other Ambulatory Visit: Payer: Self-pay

## 2020-07-14 ENCOUNTER — Observation Stay (HOSPITAL_COMMUNITY): Payer: Medicaid Other

## 2020-07-14 DIAGNOSIS — R29818 Other symptoms and signs involving the nervous system: Secondary | ICD-10-CM

## 2020-07-14 DIAGNOSIS — R233 Spontaneous ecchymoses: Secondary | ICD-10-CM

## 2020-07-14 DIAGNOSIS — R569 Unspecified convulsions: Secondary | ICD-10-CM | POA: Diagnosis not present

## 2020-07-14 LAB — COMPREHENSIVE METABOLIC PANEL
ALT: 13 U/L (ref 0–44)
AST: 15 U/L (ref 15–41)
Albumin: 3.1 g/dL — ABNORMAL LOW (ref 3.5–5.0)
Alkaline Phosphatase: 55 U/L (ref 38–126)
Anion gap: 6 (ref 5–15)
BUN: 9 mg/dL (ref 6–20)
CO2: 29 mmol/L (ref 22–32)
Calcium: 8.7 mg/dL — ABNORMAL LOW (ref 8.9–10.3)
Chloride: 109 mmol/L (ref 98–111)
Creatinine, Ser: 0.69 mg/dL (ref 0.44–1.00)
GFR, Estimated: >60 mL/min (ref >60)
Glucose, Bld: 90 mg/dL (ref 70–99)
Potassium: 3.7 mmol/L (ref 3.5–5.1)
Sodium: 144 mmol/L (ref 135–145)
Total Bilirubin: 0.4 mg/dL (ref 0.3–1.2)
Total Protein: 5.9 g/dL — ABNORMAL LOW (ref 6.5–8.1)

## 2020-07-14 LAB — TSH: TSH: 0.971 u[IU]/mL (ref 0.350–4.500)

## 2020-07-14 LAB — CBC
HCT: 37.8 % (ref 36.0–46.0)
Hemoglobin: 12.7 g/dL (ref 12.0–15.0)
MCH: 33.2 pg (ref 26.0–34.0)
MCHC: 33.6 g/dL (ref 30.0–36.0)
MCV: 99 fL (ref 80.0–100.0)
Platelets: 218 10*3/uL (ref 150–400)
RBC: 3.82 MIL/uL — ABNORMAL LOW (ref 3.87–5.11)
RDW: 13.2 % (ref 11.5–15.5)
WBC: 7.3 10*3/uL (ref 4.0–10.5)
nRBC: 0 % (ref 0.0–0.2)

## 2020-07-14 LAB — URINALYSIS, ROUTINE W REFLEX MICROSCOPIC
Bilirubin Urine: NEGATIVE
Glucose, UA: NEGATIVE mg/dL
Ketones, ur: NEGATIVE mg/dL
Leukocytes,Ua: NEGATIVE
Nitrite: NEGATIVE
Protein, ur: NEGATIVE mg/dL
Specific Gravity, Urine: 1.009 (ref 1.005–1.030)
pH: 6 (ref 5.0–8.0)

## 2020-07-14 LAB — CSF CELL COUNT WITH DIFFERENTIAL
RBC Count, CSF: 1 /mm3 — ABNORMAL HIGH
Tube #: 3
WBC, CSF: 0 /mm3 (ref 0–5)

## 2020-07-14 LAB — PROTEIN, CSF: Total  Protein, CSF: 27 mg/dL (ref 15–45)

## 2020-07-14 LAB — HIV ANTIBODY (ROUTINE TESTING W REFLEX): HIV Screen 4th Generation wRfx: NONREACTIVE

## 2020-07-14 LAB — HEMOGLOBIN A1C
Hgb A1c MFr Bld: 5.5 % (ref 4.8–5.6)
Mean Plasma Glucose: 111.15 mg/dL

## 2020-07-14 LAB — GLUCOSE, CSF: Glucose, CSF: 59 mg/dL (ref 40–70)

## 2020-07-14 LAB — HEPATITIS C ANTIBODY: HCV Ab: NONREACTIVE

## 2020-07-14 MED ORDER — LIDOCAINE-SODIUM BICARBONATE 1-8.4 % IJ SOSY: 1.0000 mL | PREFILLED_SYRINGE | INTRAMUSCULAR | Status: DC | PRN

## 2020-07-14 MED ORDER — PNEUMOCOCCAL VAC POLYVALENT 25 MCG/0.5ML IJ INJ
0.5000 mL | INJECTION | INTRAMUSCULAR | Status: DC | PRN
  Filled 2020-07-14: qty 0.5

## 2020-07-14 MED ORDER — LIDOCAINE HCL (PF) 1 % IJ SOLN
5.0000 mL | Freq: Once | INTRAMUSCULAR | Status: AC
  Administered 2020-07-14: 10 mL via INTRADERMAL

## 2020-07-14 MED ORDER — LIDOCAINE HCL 1 % IJ SOLN
10.0000 mL | Freq: Once | INTRAMUSCULAR | Status: DC
  Filled 2020-07-14: qty 10

## 2020-07-14 MED ORDER — LEVETIRACETAM 500 MG PO TABS
500.0000 mg | ORAL_TABLET | Freq: Two times a day (BID) | ORAL | Status: DC
  Administered 2020-07-14 – 2020-07-16 (×6): 500 mg via ORAL
  Filled 2020-07-14 (×6): qty 1

## 2020-07-14 MED ORDER — LIDOCAINE HCL (PF) 1 % IJ SOLN
INTRAMUSCULAR | Status: AC
  Filled 2020-07-14: qty 10

## 2020-07-14 MED ORDER — KETOROLAC TROMETHAMINE 30 MG/ML IJ SOLN
30.0000 mg | Freq: Once | INTRAMUSCULAR | Status: AC
  Administered 2020-07-14: 30 mg via INTRAVENOUS
  Filled 2020-07-14: qty 1

## 2020-07-14 MED ORDER — ACETAMINOPHEN 500 MG PO TABS
1000.0000 mg | ORAL_TABLET | Freq: Four times a day (QID) | ORAL | Status: DC | PRN
  Administered 2020-07-14 – 2020-07-16 (×5): 1000 mg via ORAL
  Filled 2020-07-14 (×5): qty 2

## 2020-07-14 NOTE — Progress Notes (Addendum)
Family Medicine Teaching Service Daily Progress Note Intern Pager: [phone]  Patient name: Bonnie Stone Medical record number: [id number] Date of birth: 09-30-1975 Age: 45 y.o. Gender: female  Primary Care Provider: Lonie Peak, PA-C Consultants: Neurology  Code Status: Full  Pt Overview and Major Events to Date:  4/11: Admitted   Assessment and Plan: Bonnie Stone is a 45 y.o. female presenting with witnessed seizure activity lasting 45 seconds. No PMHx.   Witnessed seizure-like activity Patient presented after possible seizure activity, noted that her symptoms began with a right-sided headache with sudden onset. Endorses history of migraines but reports that this headache felt different. Denied associated photophobia, phonophobia, nausea and vomiting. Denies recent head injury, trauma or loss of consciousness but does not remember her seizure. Denies urinary incontinence. Daughter witnessed generalized seizure activity that seems consistent with tonic-clonic seizure. Patient reports that this series of events took place so rapidly, noted to have a headache in the grocery store and then a seizure in the parking lot. CT head unremarkable. MRI brain no acute intracranial abnormality, multifocal hyperintense T2-weighted signal within the brainstem which may be a sequela of chronic small vessel ischemia.  -Neurology consulted, appreciate continued involvement and recommendations  -Vitals and pulse ox per unit routine -Awaiting EEG -Awaiting UDS, UA -Keppra 500 mg bid daily -Follow up TSH -neuro checks -seizure precautions  -contacted IR for lumbar puncture   Elevated blood pressures Likely resolved, no history of hypertension.  -continue to monitor BP   Elevated blood glucose Glucose on admission CMP 112. No PMHx diabetes or pre-diabetes. Will continue to monitor.  -monitor CMP -pending A1c   GERD Endorses long-standing history of acid reflux.  -protonix 20 mg daily    Tobacco use 1/2 PPD user for over 30 years.  -nicotine patch as desired -encourage cessation  Substance abuse Current marijuana use, but not daily.   -pending UDS -encourage cessation  - consider TOC consult   FEN/GI: regular PPx: lovenox    Status is: Observation  The patient remains OBS appropriate and will d/c before 2 midnights.  Dispo: The patient is from: Home              Anticipated d/c is to: Home              Patient currently is not medically stable to d/c.   Difficult to place patient No        Subjective:  Attempt at LP performed last night. Denies chest pain, dyspnea, vision changes and generalized pain. Endorsing headache.  Objective: Temp:  [98.4 F (36.9 C)] 98.4 F (36.9 C) (04/10 1819) Pulse Rate:  [69-79] 76 (04/11 0231) Resp:  [17-31] 22 (04/11 0045) BP: (111-140)/(75-99) 111/80 (04/11 0231) SpO2:  [97 %-100 %] 99 % (04/11 0231) Physical Exam: General: Patient sitting in bed, in no acute distress. Cardiovascular: RRR, no murmurs or gallops auscultated Respiratory: CTAB, no wheezing, rales or rhonchi Abdomen: soft, nontender, presence of active bowel sounds Neuro: AOx4, decreased sensation on the left UE prominently, CN2-12 grossly intact with exception of slight hearing loss on left side compared to the right, 5/5 UE bilaterally and left LE, 5/5 strip strength, no pronator drift, normal finger to nose testing  Extremities: no LE edema noted, radial and distal pulses strong and equal bilaterally  Psych: mood appropriate   Laboratory: Recent Labs  Lab 07/13/20 1910  WBC 9.6  HGB 12.7  HCT 38.2  PLT 230   Recent Labs  Lab 07/13/20 1910  NA  143  K 3.8  CL 112*  CO2 24  BUN 8  CREATININE 0.62  CALCIUM 8.5*  PROT 5.9*  BILITOT 0.5  ALKPHOS 56  ALT 11  AST 22  GLUCOSE 84      Imaging/Diagnostic Tests: CT Head Wo Contrast  Result Date: 07/13/2020 CLINICAL DATA:  Seizure EXAM: CT HEAD WITHOUT CONTRAST TECHNIQUE:  Contiguous axial images were obtained from the base of the skull through the vertex without intravenous contrast. COMPARISON:  04/20/2017 FINDINGS: Brain: There is no mass, hemorrhage or extra-axial collection. The size and configuration of the ventricles and extra-axial CSF spaces are normal. The brain parenchyma is normal, without acute or chronic infarction. Vascular: No abnormal hyperdensity of the major intracranial arteries or dural venous sinuses. No intracranial atherosclerosis. Skull: The visualized skull base, calvarium and extracranial soft tissues are normal. Sinuses/Orbits: No fluid levels or advanced mucosal thickening of the visualized paranasal sinuses. No mastoid or middle ear effusion. The orbits are normal. IMPRESSION: Normal head CT. Electronically Signed   By: Deatra Robinson M.D.   On: 07/13/2020 19:52   MR BRAIN WO CONTRAST  Result Date: 07/14/2020 CLINICAL DATA:  Seizure EXAM: MRI HEAD WITHOUT CONTRAST TECHNIQUE: Multiplanar, multiecho pulse sequences of the brain and surrounding structures were obtained without intravenous contrast. COMPARISON:  None. FINDINGS: Brain: No acute infarct, mass effect or extra-axial collection. No acute or chronic hemorrhage. There is multifocal hyperintense T2-weighted signal within the brainstem. Parenchymal volume and CSF spaces are normal. The midline structures are normal. Vascular: Major flow voids are preserved. Skull and upper cervical spine: Normal calvarium and skull base. Visualized upper cervical spine and soft tissues are normal. Sinuses/Orbits:No paranasal sinus fluid levels or advanced mucosal thickening. No mastoid or middle ear effusion. Normal orbits. IMPRESSION: 1. No acute intracranial abnormality. 2. Multifocal hyperintense T2-weighted signal within the brainstem, which may be a sequela of chronic small vessel ischemia. Electronically Signed   By: Deatra Robinson M.D.   On: 07/14/2020 00:02    Reece Leader, DO 07/14/2020, 6:47 AM PGY-1,  [facility] Family Medicine FPTS Intern pager: [phone], text pages welcome

## 2020-07-14 NOTE — Progress Notes (Signed)
Neurology Progress Note  S: No further seizure activity since admission. Still having HAs refractory to Tylenol. Is teary eyed because she is not at home and can not take care of her grandchildren.   O: Current vital signs: BP 111/80   Pulse 76   Temp 98.4 F (36.9 C) (Oral)   Resp (!) 22   Wt 62.8 kg   LMP 03/16/2017 (Approximate)   SpO2 99%   BMI 23.77 kg/m  Vital signs in last 24 hours: Temp:  [98.4 F (36.9 C)] 98.4 F (36.9 C) (04/10 1819) Pulse Rate:  [69-79] 76 (04/11 0231) Resp:  [17-31] 22 (04/11 0045) BP: (111-140)/(75-99) 111/80 (04/11 0231) SpO2:  [97 %-100 %] 99 % (04/11 0231) Weight:  [62.8 kg] 62.8 kg (04/11 0800)  GENERAL: Awake, alert in NAD. Teary at times. Appears well.  HEENT: Normocephalic and atraumatic. No neck stiffness on exam.  LUNGS: Normal respiratory effort.  CV: RRR   Ext: warm, well perfused. No LE edema.    NEURO:  Mental Status: AA&Ox3  Speech/Language: speech is without aphasia or dysarthria.  Naming, repetition, fluency, and comprehension intact.  Cranial Nerves:  II: PERRL. Visual fields full.  III, IV, VI: EOMI. Eyelids elevate symmetrically.  V: Sensation is intact to light touch and symmetrical to face.  VII: Smile is symmetrical. Able to puff cheeks and raise eyebrows.  VIII: hearing intact to voice. IX, X: Palate elevates symmetrically. Phonation is normal.  ZO:XWRUEAVW shrug 5/5. XII: tongue is midline without fasciculations. Motor: 5/5 strength to all muscle groups tested.  Tone: is normal and bulk is normal Sensation- Intact to light touch bilaterally but states it is slightly less to the right foot up to knee. Extinction absent to light touch to DSS.    Coordination: FTN intact bilaterally, HKS: no ataxia in BLE. No drift.  Gait- deferred  Medications  Current Facility-Administered Medications:  .  0.9 %  sodium chloride infusion, 250 mL, Intravenous, PRN, Fayette Pho, MD .  0.9 %  sodium chloride infusion, ,  Intravenous, Continuous, Fayette Pho, MD, Last Rate: 125 mL/hr at 07/14/20 0123, New Bag at 07/14/20 0123 .  acetaminophen (TYLENOL) tablet 1,000 mg, 1,000 mg, Oral, Q6H PRN, Fayette Pho, MD, 1,000 mg at 07/14/20 0606 .  acyclovir (ZOVIRAX) 640 mg in dextrose 5 % 100 mL IVPB, 640 mg, Intravenous, Q8H, McDiarmid, Leighton Roach, MD, Last Rate: 112.8 mL/hr at 07/14/20 0813, 640 mg at 07/14/20 0813 .  enoxaparin (LOVENOX) injection 40 mg, 40 mg, Subcutaneous, Q24H, Fayette Pho, MD, 40 mg at 07/13/20 2155 .  levETIRAcetam (KEPPRA) tablet 500 mg, 500 mg, Oral, BID, Fayette Pho, MD, 500 mg at 07/14/20 0981 .  lidocaine (XYLOCAINE) 1 % (with pres) injection 10 mL, 10 mL, Intradermal, Once, Fayette Pho, MD .  nicotine (NICODERM CQ - dosed in mg/24 hr) patch 7 mg, 7 mg, Transdermal, Daily, Fayette Pho, MD, 7 mg at 07/13/20 2154 .  pantoprazole (PROTONIX) EC tablet 20 mg, 20 mg, Oral, Daily, Fayette Pho, MD, 20 mg at 07/14/20 0025 .  polyethylene glycol (MIRALAX / GLYCOLAX) packet 17 g, 17 g, Oral, Daily PRN, Fayette Pho, MD .  sodium chloride flush (NS) 0.9 % injection 3 mL, 3 mL, Intravenous, Q12H, Fayette Pho, MD, 3 mL at 07/13/20 2157 .  sodium chloride flush (NS) 0.9 % injection 3 mL, 3 mL, Intravenous, PRN, Fayette Pho, MD  Pertinent Labs ETOH < 10.   Imaging  CT Head is normal.   MRI Brain  1. No  acute intracranial abnormality. 2. Multifocal hyperintense T2-weighted signal within the brainstem, which may be a sequela of chronic small vessel ischemia.  Assessment: 45 yo WF who was shopping with her daughter yesterday and began to have a HA inside the store. She had prodrome of malodorous odor. She had witness seizure activity by her daughter in the parking lot described as tonic clonic lasting approximately 45 seconds. She was confused post seizure activity with slow normalization of her mental status over 8 minutes. All of the above are suspicious for seizure  disorder. MRI brain and CTH were essentially normal except for multifocal hyperintense T2 weighted signal within brainstem which may be a sequela of chronic small vessel ischemia. ETOH level was < 10. Patient with a past history of cocaine abuse and present THC use and toxicology is pending. History of shingles in the past could be concerning for herpetic encephalitis/meningitis although she has no meningeal signs on exam except for HA. Acyclovir has been started empirically. LP was attempted at bedside with no CSF return. Patient is to have LP under fluoroscopy today with orders for CSF testing in the chart. EEG is being placed now.   Plan:  1. First time seizure in a 58 yo who's son has epilepsy. Continue Keppra 500mg  po q12 hrs. Seizure precautions.  2. ?  HSV encephalitis/meningitis-lower on differential list.  3. Hx Cocaine abuse and THC use-toxicology report pending. 4. Provoked seizure work up-[facility] normal. UA with urine culture needs to be obtained.  5. LP today-f/up results 6. Adult EEG pending.  7. HAs-on Tylenol.  Rest per primary team.   Pt seen by , MSN, APN-BC/Nurse Practitioner/Neuro and later by MD. Note and plan to be edited as needed by MD.  Pager: Jimmye Norman  I have seen the patient reviewed the above note.  She has been started on Keppra, which given the focality of her initial seizure is not completely unreasonable.  She does have some changes in her brainstem that looks like a small vessel disease, I think that with a history of cocaine abuse, this is likely contributory.  She does not have anything obviously concerning for seizures.  Her LP is bland, not concerning for infection.  EEG is nonspecific. If cocaine is positive, this is the only finding that might change management at this time, as then I would not continue anti-epileptic therapy.   I had a discussion with her and her daughter about antiepileptic therapy, and they are very much in favor of  continuing antiepileptic therapy.  With the first ever seizure, this is not mandated, but is reasonable.  With her improvement and bland CSF, I think that acyclovir can be discontinued. No further recommendations at this time. I have discussed no driving with the patient and daughter.   [phone], MD [facility] [phone]  If 7pm- 7am, please page neurology on call as listed in AMION.

## 2020-07-14 NOTE — Progress Notes (Signed)
EEG complete - results pending 

## 2020-07-14 NOTE — Progress Notes (Signed)
Patient ID: Bonnie Stone, female   DOB: 02-22-1976, 45 y.o.   MRN: [id number] CLINICAL DATA: [Headaches and seizures.] EXAM: DIAGNOSTIC LUMBAR PUNCTURE UNDER FLUOROSCOPIC GUIDANCE COMPARISON: [None.] FLUOROSCOPY TIME: Fluoroscopy Time: [1 minutes 9 seconds] Radiation Exposure Index (if provided by the fluoroscopic device): []  Number of Acquired Spot Images: [0] PROCEDURE: Informed consent was obtained from the patient prior to the procedure, including potential complications of headache, allergy, and pain. With the patient prone, the lower back was prepped with Betadine. 1% Lidocaine was used for local anesthesia. Lumbar puncture was performed at the [L4-5] level using a [20] gauge needle with return of [clear] CSF with an opening pressure of [18] cm water. [9] ml of CSF were obtained for laboratory studies. The patient tolerated the procedure well and there were no apparent complications.  IMPRESSION: [Successful lumbar puncture under fluoroscopy.]

## 2020-07-14 NOTE — Procedures (Signed)
Indication: Seizures, rash, c/f herpetic infection  Risks of the procedure were dicussed with the patient including post-LP headache, bleeding, infection, weakness/numbness of legs(radiculopathy), death.  The patient agreed and written consent was obtained by resident Dr. Fayette Pho, who was supervised by myself for this procedure.   The patient was prepped and draped, and using sterile technique a 20 gauge quinke spinal needle was inserted in the L3-L4 space. A single attempt was made by Dr. Larita Fife and then myself at the same space, which the patient tolerated well without any immediate complications. However, no CSF was obtained.   Patient was instructed on post-LP precautions.   Brooke Dare MD-PhD [facility] [phone] Available 7 PM to 7 AM, outside of these hours please call Neurologist on call as listed on Amion.

## 2020-07-14 NOTE — Procedures (Signed)
  PROCEDURE: EEG routine 25 minutes with video study  DATES OF TEST: 07/14/20   REASON FOR TEST: Seizures  AED/Sedative MEDICATIONS: n/a  TECHNIQUE: This is an 18 channel digital EEG recording using the standard international 10/20 system of electrode placement with one channel EKG recording. Pt was awake and sleep during this recording.  FINDINGS:  Background rhythm:symmetric 4-8 Hz,   Amplitude : normal and high  Continuity: continuous Breach effect:  NO Variability: YES Reactivity: YES Rhythmic delta activity: NO Periodic discharges: NO Sporadic epileptiform discharges:NO Electrographic/electroclinical seizures:   Limited EKG reveals no abnormalities.    IMPRESSION:  This is an abnormal study due to - 1. Generalized slowing is a non-specific finding consistent with a generalized disturbance of cerebral functioning including toxic, metabolic, or structural abnormalities that are multi-focal or diffuse. 2. No definite epileptiform discharges or electrographic seizures noted.

## 2020-07-14 NOTE — Progress Notes (Signed)
Patient c/o headache and leg pain. Notified intern.

## 2020-07-15 DIAGNOSIS — R569 Unspecified convulsions: Secondary | ICD-10-CM | POA: Diagnosis not present

## 2020-07-15 DIAGNOSIS — R29818 Other symptoms and signs involving the nervous system: Secondary | ICD-10-CM | POA: Diagnosis not present

## 2020-07-15 LAB — CBC
HCT: 37.2 % (ref 36.0–46.0)
Hemoglobin: 12.6 g/dL (ref 12.0–15.0)
MCH: 33.5 pg (ref 26.0–34.0)
MCHC: 33.9 g/dL (ref 30.0–36.0)
MCV: 98.9 fL (ref 80.0–100.0)
Platelets: 209 10*3/uL (ref 150–400)
RBC: 3.76 MIL/uL — ABNORMAL LOW (ref 3.87–5.11)
RDW: 12.7 % (ref 11.5–15.5)
WBC: 7.4 10*3/uL (ref 4.0–10.5)
nRBC: 0 % (ref 0.0–0.2)

## 2020-07-15 LAB — URINE CULTURE: Culture: NO GROWTH

## 2020-07-15 LAB — RAPID URINE DRUG SCREEN, HOSP PERFORMED
Amphetamines: NOT DETECTED
Barbiturates: NOT DETECTED
Benzodiazepines: NOT DETECTED
Cocaine: NOT DETECTED
Opiates: NOT DETECTED
Tetrahydrocannabinol: POSITIVE — AB

## 2020-07-15 LAB — BASIC METABOLIC PANEL
Anion gap: 5 (ref 5–15)
BUN: 12 mg/dL (ref 6–20)
CO2: 28 mmol/L (ref 22–32)
Calcium: 9 mg/dL (ref 8.9–10.3)
Chloride: 108 mmol/L (ref 98–111)
Creatinine, Ser: 0.78 mg/dL (ref 0.44–1.00)
GFR, Estimated: >60 mL/min (ref >60)
Glucose, Bld: 90 mg/dL (ref 70–99)
Potassium: 4 mmol/L (ref 3.5–5.1)
Sodium: 141 mmol/L (ref 135–145)

## 2020-07-15 LAB — VARICELLA ZOSTER ANTIBODY, IGG: Varicella IgG: 1347 index (ref >165)

## 2020-07-15 MED ORDER — PROCHLORPERAZINE EDISYLATE 10 MG/2ML IJ SOLN
10.0000 mg | Freq: Four times a day (QID) | INTRAMUSCULAR | Status: DC | PRN
  Administered 2020-07-15 – 2020-07-16 (×2): 10 mg via INTRAVENOUS
  Filled 2020-07-15 (×4): qty 2

## 2020-07-15 MED ORDER — ONDANSETRON HCL 4 MG/2ML IJ SOLN
4.0000 mg | Freq: Once | INTRAMUSCULAR | Status: DC | PRN
  Filled 2020-07-15: qty 2

## 2020-07-15 MED ORDER — KETOROLAC TROMETHAMINE 30 MG/ML IJ SOLN
30.0000 mg | Freq: Once | INTRAMUSCULAR | Status: AC
  Administered 2020-07-15: 30 mg via INTRAVENOUS
  Filled 2020-07-15: qty 1

## 2020-07-15 MED ORDER — ONDANSETRON HCL 4 MG/2ML IJ SOLN
INTRAMUSCULAR | Status: AC
  Filled 2020-07-15: qty 2

## 2020-07-15 MED ORDER — ONDANSETRON HCL 4 MG/2ML IJ SOLN: 4.0000 mg | Freq: Once | INTRAMUSCULAR | Status: DC

## 2020-07-15 MED ORDER — ONDANSETRON HCL 4 MG/2ML IJ SOLN
4.0000 mg | Freq: Once | INTRAMUSCULAR | Status: AC | PRN
  Administered 2020-07-15: 4 mg via INTRAVENOUS

## 2020-07-15 NOTE — Progress Notes (Signed)
Was called to bedside by patient, found patient crying hysterically saying, "please help me, my head is killing me." Patient scores a 10 on a 0-10 pain scale in her head. States that it's a stabbing pain. Patient states, " I don't feel right." Patient starts gaging and trying to throw up. Paged MD. New orders placed. Meds given.

## 2020-07-15 NOTE — Plan of Care (Signed)

## 2020-07-15 NOTE — Progress Notes (Signed)
Called the nurse to check in on Bonnie Stone. Nurse reports patient is sleeping soundly, HR 88. No seizure activity. No further intervention at this time.   Fayette Pho, MD

## 2020-07-15 NOTE — Plan of Care (Signed)

## 2020-07-15 NOTE — Progress Notes (Signed)
FPTS Interim Progress Note  S: Paged by RN about patient having severe headache and nausea. Ordered ketorolac and given prn dose of Compazine.  Went to bedside to assess patient about 2 hours afterwards. Patient states she was having a severe headache with vision changes and nausea earlier. Now much improved, headache rated 5/10 in severity and vision changes have resolved. No longer feeling nauseous.  O: BP (!) 135/93 (BP Location: Right Arm)   Pulse 71   Temp 98 F (36.7 C) (Oral)   Resp 17   Wt 62.8 kg   LMP 03/16/2017 (Approximate)   SpO2 97%   BMI 23.77 kg/m   Gen: alert, sitting comfortably in bed, NAD  A/P: Severe headache Now improved s/p ketorolac and Compazine. - continue to monitor  Littie Deeds, MD 07/15/2020, 7:03 PM PGY-1, [facility] Family [facility] Service pager [phone]

## 2020-07-15 NOTE — Progress Notes (Signed)
FPTS Interim Progress Note  S:Received page from nurse regarding headache and leg pain. Patient has been experiencing sharp, right sided headache with nausea and bilateral lower extremity aching for about an hour or so. Has been smelling ammonia and feeling flushed. Patient says "I just don't feel right". Patient reports this is how she felt before her seizure that brought her into the hospital.   O: BP 131/89 (BP Location: Right Arm)   Pulse 72   Temp 98 F (36.7 C) (Oral)   Resp 20   Wt 62.8 kg   LMP 03/16/2017 (Approximate)   SpO2 100%   BMI 23.77 kg/m   General: awake, alert, moderate distress, intermittently tearful Cardiac: well perfused, warm extremities Resp: unlabored respirations, no respiratory distress Abd: non-distended, patient retching during exam Ext: warm BLE with palpable pedal pulses, TTP diffusely   A/P: Worsening nausea, vomiting, headache. Given timing of onset and characteristics, much more likely related to underlying yet-to-be determined cause of seizure than previous LP done this morning with IR.  - zofran IV 4 mg once - toradol IV 30 mg once - cardiac monitoring per nursing request - bed rest with seizure precautions - monitor closely  Fayette Pho, MD 07/15/2020, 12:26 AM PGY-1, [facility] Service pager [phone]

## 2020-07-15 NOTE — Progress Notes (Signed)
Family Medicine Teaching Service Daily Progress Note Intern Pager: [phone]  Patient name: Bonnie Stone Medical record number: [id number] Date of birth: 11-04-1975 Age: 45 y.o. Gender: female  Primary Care Provider: Lonie Peak, PA-C Consultants: Neurology  Code Status: Full  Pt Overview and Major Events to Date:  4/1: Admitted  Assessment and Plan: Bonnie A Hilliardis a 45 y.o.femalepresenting with witnessed seizure activity lasting 45 seconds.No PMHx.   Witnessed seizure-like activity Endorsing headache this morning with vomiting. CT head unremarkable. MRI brain no acute intracranial abnormality, multifocal hyperintense T2-weighted signal within the brainstem which may be a sequela of chronic small vessel ischemia. UDS positive for THC use, could be possible etiology of seizure-activity. EEG notable for no definite epileptiform discharge or electrographic seizures noted. TSH wnl. LP overall thus far not concerning for infectious etiology, viral or bacterial. Varicella IgG immune. -Neurology consulted, appreciate continued involvement and recommendations  -Vitals and pulse ox per unit routine -Keppra 500 mg bid daily -acyclovir 640 mg q8h -neuro checks -seizure precautions  -pending CSF culture  -pending HSV PCR -consider discontinuing acyclovir given overall normal LP but will defer to neurology   Nausea and vomiting Experiencing nausea and vomiting again this morning, likely due to migraine or THC use. -zofran 4 mg prn -encouraged THC cessation   Elevated blood pressures BP 121/72. Likely resolved, no history of hypertension.  -continue to monitor BP   Elevated blood glucose Resolved. Blood glucose 90 this morning. Glucose on admission CMP 112. No PMHx diabetes or pre-diabetes. A1c 5.5.  -monitor CMP  GERD Endorses long-standing history of acid reflux.  -protonix 20 mg daily   Tobacco use 1/2 PPD user for over 30 years.  -nicotine patch as  desired -encourage cessation  Substance abuse Current marijuana use, but not daily. UDS positive for THC.  -encourage cessation  -TOC consulted   FEN/GI: regular PPx: lovenox    Status is: Observation  The patient remains OBS appropriate and will d/c before 2 midnights.  Dispo: The patient is from: Home              Anticipated d/c is to: Home              Patient currently is not medically stable to d/c.   Difficult to place patient No        Subjective:  Patient noted to have headache accompanied by nausea which she endorses is how she felt prior to her seizure before admission. Given zofran and toradol overnight which resolved her symptoms. Today she endorses having a headache with nausea. Became tearful at the end as she is unsure why she is feeling this way. Reassurance provided as testing is still ongoing, encouraged THC cessation which she agrees. Spoke with spouse, Bonnie Stone, provided update thus far. He endorsed that his wife has been having fluctuating mood recently. He did not have any further questions or concerns at this time.   Objective: Temp:  [97.9 F (36.6 C)-98 F (36.7 C)] 97.9 F (36.6 C) (04/12 0507) Pulse Rate:  [71-85] 71 (04/12 0507) Resp:  [16-20] 20 (04/12 0507) BP: (117-131)/(72-89) 121/72 (04/12 0507) SpO2:  [97 %-100 %] 97 % (04/12 0507) Weight:  [62.8 kg] 62.8 kg (04/11 0800) Physical Exam: General: Patient laying in bed comfortably talking to her husband over the phone, in no acute distress. Head: petechial rash above right eye still present Neck: supple, no evidence of lymphadenopathy, non-tender thyroid Cardiovascular: RRR, no murmurs or gallops auscultated Respiratory: CTAB, no rales or rhonchi  noted Abdomen: soft, nontender, BS+ Extremities: no LE edema noted bilaterally, right leg seems to be more restless than left, radial and distal pulses strong and equal bilaterally  Neuro: AOx4, gross sensation intact, CN 2-12 grossly intake  with exception of slight hearing loss of left ear, 5/5 grip strength bilaterally, 5/5 UE and LE strength bilaterally  Psych: mood appropriate although tearful towards the end of the encounter   Laboratory: Recent Labs  Lab 07/13/20 1910 07/14/20 0711 07/15/20 0401  WBC 9.6 7.3 7.4  HGB 12.7 12.7 12.6  HCT 38.2 37.8 37.2  PLT 230 218 209   Recent Labs  Lab 07/13/20 1910 07/14/20 0711 07/15/20 0401  NA 143 144 141  K 3.8 3.7 4.0  CL 112* 109 108  CO2 24 29 28   BUN 8 9 12   CREATININE 0.62 0.69 0.78  CALCIUM 8.5* 8.7* 9.0  PROT 5.9* 5.9*  --   BILITOT 0.5 0.4  --   ALKPHOS 56 55  --   ALT 11 13  --   AST 22 15  --   GLUCOSE 84 90 90      Imaging/Diagnostic Tests: EEG adult  Result Date: 07/14/2020 Plancher, , MD     07/14/2020  1:15 PM PROCEDURE: EEG routine 25 minutes with video study DATES OF TEST: 07/14/20 REASON FOR TEST: Seizures AED/Sedative MEDICATIONS: n/a TECHNIQUE: This is an 18 channel digital EEG recording using the standard international 10/20 system of electrode placement with one channel EKG recording. Pt was awake and sleep during this recording. FINDINGS: Background rhythm:symmetric 4-8 Hz,  Amplitude : normal and high Continuity: continuous Breach effect:  NO Variability: YES Reactivity: YES Rhythmic delta activity: NO Periodic discharges: NO Sporadic epileptiform discharges:NO Electrographic/electroclinical seizures: Limited EKG reveals no abnormalities. IMPRESSION: This is an abnormal study due to - 1. Generalized slowing is a non-specific finding consistent with a generalized disturbance of cerebral functioning including toxic, metabolic, or structural abnormalities that are multi-focal or diffuse. 2. No definite epileptiform discharges or electrographic seizures noted.   DG FL GUIDED LUMBAR PUNCTURE  Result Date: 07/14/2020 CLINICAL DATA:  Headaches and seizures. EXAM: DIAGNOSTIC LUMBAR PUNCTURE UNDER FLUOROSCOPIC GUIDANCE COMPARISON:  None.  FLUOROSCOPY TIME:  Fluoroscopy Time:  1 minutes 9 seconds Radiation Exposure Index (if provided by the fluoroscopic device): Number of Acquired Spot Images: 0 PROCEDURE: Informed consent was obtained from the patient prior to the procedure, including potential complications of headache, allergy, and pain. With the patient prone, the lower back was prepped with Betadine. 1% Lidocaine was used for local anesthesia. Lumbar puncture was performed at the L4-5 level using a 20 gauge needle with return of clear CSF with an opening pressure of 18 cm water. 9 ml of CSF were obtained for laboratory studies. The patient tolerated the procedure well and there were no apparent complications. IMPRESSION: Successful lumbar puncture under fluoroscopy. Electronically Signed   By: 11/20 M.D.   On: 07/14/2020 15:15    Leanna Battles, DO 07/15/2020, 6:43 AM PGY-1, [facility] FPTS Intern pager: [phone], text pages welcome

## 2020-07-16 ENCOUNTER — Observation Stay (HOSPITAL_COMMUNITY)
Admission: EM | Admit: 2020-07-16 | Discharge: 2020-07-18 | Disposition: A | Discharge status: Home / Self Care | Payer: Medicaid Other | Attending: Family Medicine | Admitting: Family Medicine

## 2020-07-16 ENCOUNTER — Other Ambulatory Visit (HOSPITAL_COMMUNITY): Payer: Self-pay

## 2020-07-16 ENCOUNTER — Other Ambulatory Visit: Payer: Self-pay

## 2020-07-16 ENCOUNTER — Emergency Department (HOSPITAL_COMMUNITY): Payer: Medicaid Other

## 2020-07-16 ENCOUNTER — Encounter (HOSPITAL_COMMUNITY): Payer: Self-pay | Admitting: Emergency Medicine

## 2020-07-16 DIAGNOSIS — R29818 Other symptoms and signs involving the nervous system: Secondary | ICD-10-CM | POA: Diagnosis not present

## 2020-07-16 DIAGNOSIS — W57XXXA Bitten or stung by nonvenomous insect and other nonvenomous arthropods, initial encounter: Secondary | ICD-10-CM | POA: Diagnosis present

## 2020-07-16 DIAGNOSIS — F1210 Cannabis abuse, uncomplicated: Secondary | ICD-10-CM | POA: Diagnosis present

## 2020-07-16 DIAGNOSIS — G40909 Epilepsy, unspecified, not intractable, without status epilepticus: Secondary | ICD-10-CM | POA: Diagnosis present

## 2020-07-16 DIAGNOSIS — R739 Hyperglycemia, unspecified: Secondary | ICD-10-CM | POA: Diagnosis present

## 2020-07-16 DIAGNOSIS — R112 Nausea with vomiting, unspecified: Secondary | ICD-10-CM | POA: Diagnosis present

## 2020-07-16 DIAGNOSIS — F319 Bipolar disorder, unspecified: Secondary | ICD-10-CM | POA: Diagnosis present

## 2020-07-16 DIAGNOSIS — Z820 Family history of epilepsy and other diseases of the nervous system: Secondary | ICD-10-CM | POA: Diagnosis not present

## 2020-07-16 DIAGNOSIS — K219 Gastro-esophageal reflux disease without esophagitis: Secondary | ICD-10-CM | POA: Insufficient documentation

## 2020-07-16 DIAGNOSIS — Z79899 Other long term (current) drug therapy: Secondary | ICD-10-CM | POA: Insufficient documentation

## 2020-07-16 DIAGNOSIS — Z91041 Radiographic dye allergy status: Secondary | ICD-10-CM | POA: Diagnosis not present

## 2020-07-16 DIAGNOSIS — F17210 Nicotine dependence, cigarettes, uncomplicated: Secondary | ICD-10-CM | POA: Insufficient documentation

## 2020-07-16 DIAGNOSIS — R569 Unspecified convulsions: Principal | ICD-10-CM | POA: Insufficient documentation

## 2020-07-16 DIAGNOSIS — Z20822 Contact with and (suspected) exposure to covid-19: Secondary | ICD-10-CM | POA: Diagnosis present

## 2020-07-16 DIAGNOSIS — H532 Diplopia: Secondary | ICD-10-CM | POA: Diagnosis present

## 2020-07-16 DIAGNOSIS — R030 Elevated blood-pressure reading, without diagnosis of hypertension: Secondary | ICD-10-CM | POA: Diagnosis present

## 2020-07-16 DIAGNOSIS — Z9049 Acquired absence of other specified parts of digestive tract: Secondary | ICD-10-CM | POA: Diagnosis not present

## 2020-07-16 DIAGNOSIS — E878 Other disorders of electrolyte and fluid balance, not elsewhere classified: Secondary | ICD-10-CM | POA: Diagnosis not present

## 2020-07-16 DIAGNOSIS — M79661 Pain in right lower leg: Secondary | ICD-10-CM | POA: Insufficient documentation

## 2020-07-16 DIAGNOSIS — Y900 Blood alcohol level of less than 20 mg/100 ml: Secondary | ICD-10-CM | POA: Diagnosis not present

## 2020-07-16 DIAGNOSIS — S40862A Insect bite (nonvenomous) of left upper arm, initial encounter: Secondary | ICD-10-CM | POA: Diagnosis present

## 2020-07-16 DIAGNOSIS — S40861A Insect bite (nonvenomous) of right upper arm, initial encounter: Secondary | ICD-10-CM | POA: Diagnosis present

## 2020-07-16 LAB — BASIC METABOLIC PANEL
Anion gap: 5 (ref 5–15)
BUN: 12 mg/dL (ref 6–20)
CO2: 28 mmol/L (ref 22–32)
Calcium: 9 mg/dL (ref 8.9–10.3)
Chloride: 109 mmol/L (ref 98–111)
Creatinine, Ser: 0.62 mg/dL (ref 0.44–1.00)
GFR, Estimated: >60 mL/min (ref >60)
Glucose, Bld: 78 mg/dL (ref 70–99)
Potassium: 4.1 mmol/L (ref 3.5–5.1)
Sodium: 142 mmol/L (ref 135–145)

## 2020-07-16 LAB — COMPREHENSIVE METABOLIC PANEL
ALT: 15 U/L (ref 0–44)
AST: 21 U/L (ref 15–41)
Albumin: 3.8 g/dL (ref 3.5–5.0)
Alkaline Phosphatase: 57 U/L (ref 38–126)
Anion gap: 7 (ref 5–15)
BUN: 7 mg/dL (ref 6–20)
CO2: 30 mmol/L (ref 22–32)
Calcium: 9.6 mg/dL (ref 8.9–10.3)
Chloride: 104 mmol/L (ref 98–111)
Creatinine, Ser: 0.8 mg/dL (ref 0.44–1.00)
GFR, Estimated: >60 mL/min (ref >60)
Glucose, Bld: 92 mg/dL (ref 70–99)
Potassium: 3.7 mmol/L (ref 3.5–5.1)
Sodium: 141 mmol/L (ref 135–145)
Total Bilirubin: 0.5 mg/dL (ref 0.3–1.2)
Total Protein: 7 g/dL (ref 6.5–8.1)

## 2020-07-16 LAB — CBC
HCT: 38 % (ref 36.0–46.0)
Hemoglobin: 12.7 g/dL (ref 12.0–15.0)
MCH: 32.8 pg (ref 26.0–34.0)
MCHC: 33.4 g/dL (ref 30.0–36.0)
MCV: 98.2 fL (ref 80.0–100.0)
Platelets: 207 10*3/uL (ref 150–400)
RBC: 3.87 MIL/uL (ref 3.87–5.11)
RDW: 12.9 % (ref 11.5–15.5)
WBC: 7.7 10*3/uL (ref 4.0–10.5)
nRBC: 0 % (ref 0.0–0.2)

## 2020-07-16 LAB — CBC WITH DIFFERENTIAL/PLATELET
Abs Immature Granulocytes: 0.03 10*3/uL (ref 0.00–0.07)
Basophils Absolute: 0.1 10*3/uL (ref 0.0–0.1)
Basophils Relative: 1 %
Eosinophils Absolute: 0.3 10*3/uL (ref 0.0–0.5)
Eosinophils Relative: 4 %
HCT: 41.3 % (ref 36.0–46.0)
Hemoglobin: 14.1 g/dL (ref 12.0–15.0)
Immature Granulocytes: 0 %
Lymphocytes Relative: 39 %
Lymphs Abs: 3.5 10*3/uL (ref 0.7–4.0)
MCH: 33.5 pg (ref 26.0–34.0)
MCHC: 34.1 g/dL (ref 30.0–36.0)
MCV: 98.1 fL (ref 80.0–100.0)
Monocytes Absolute: 0.6 10*3/uL (ref 0.1–1.0)
Monocytes Relative: 7 %
Neutro Abs: 4.5 10*3/uL (ref 1.7–7.7)
Neutrophils Relative %: 49 %
Platelets: 224 10*3/uL (ref 150–400)
RBC: 4.21 MIL/uL (ref 3.87–5.11)
RDW: 12.5 % (ref 11.5–15.5)
WBC: 9.1 10*3/uL (ref 4.0–10.5)
nRBC: 0 % (ref 0.0–0.2)

## 2020-07-16 LAB — HSV 1/2 PCR, CSF
HSV-1 DNA: NEGATIVE
HSV-2 DNA: NEGATIVE

## 2020-07-16 MED ORDER — LORAZEPAM 2 MG/ML IJ SOLN
1.0000 mg | Freq: Once | INTRAMUSCULAR | Status: AC
  Administered 2020-07-16: 1 mg via INTRAVENOUS
  Filled 2020-07-16: qty 1

## 2020-07-16 MED ORDER — IOHEXOL 350 MG/ML SOLN
75.0000 mL | Freq: Once | INTRAVENOUS | Status: AC | PRN
  Administered 2020-07-16: 75 mL via INTRAVENOUS

## 2020-07-16 MED ORDER — LEVETIRACETAM 500 MG PO TABS
1000.0000 mg | ORAL_TABLET | Freq: Two times a day (BID) | ORAL | Status: DC
  Administered 2020-07-17 (×2): 1000 mg via ORAL
  Filled 2020-07-16 (×2): qty 2

## 2020-07-16 MED ORDER — SODIUM CHLORIDE 0.9 % IV BOLUS
1000.0000 mL | Freq: Once | INTRAVENOUS | Status: AC
  Administered 2020-07-16: 1000 mL via INTRAVENOUS

## 2020-07-16 MED ORDER — LEVETIRACETAM 500 MG PO TABS
500.0000 mg | ORAL_TABLET | Freq: Two times a day (BID) | ORAL | 0 refills | Status: DC
  Filled 2020-07-16: qty 60, 30d supply, fill #0

## 2020-07-16 MED ORDER — LEVETIRACETAM IN NACL 1500 MG/100ML IV SOLN
1500.0000 mg | Freq: Once | INTRAVENOUS | Status: AC
  Administered 2020-07-16: 1500 mg via INTRAVENOUS
  Filled 2020-07-16: qty 100

## 2020-07-16 MED ORDER — PROCHLORPERAZINE EDISYLATE 10 MG/2ML IJ SOLN
10.0000 mg | Freq: Once | INTRAMUSCULAR | Status: AC
  Administered 2020-07-16: 10 mg via INTRAVENOUS
  Filled 2020-07-16: qty 2

## 2020-07-16 MED ORDER — DIPHENHYDRAMINE HCL 50 MG/ML IJ SOLN
25.0000 mg | Freq: Once | INTRAMUSCULAR | Status: AC
  Administered 2020-07-16: 25 mg via INTRAVENOUS
  Filled 2020-07-16: qty 1

## 2020-07-16 NOTE — ED Notes (Signed)
EEG at bedside.

## 2020-07-16 NOTE — ED Notes (Signed)
Pt to CT

## 2020-07-16 NOTE — Discharge Summary (Signed)
Family Medicine Teaching [facility] Discharge Summary  Patient name: Bonnie Stone Medical record number: [id number] Date of birth: 1975/06/28 Age: 45 y.o. Gender: female Date of Admission: 07/13/2020  Date of Discharge: 07/16/2020 Admitting Physician: Fayette Pho, MD  Primary Care Provider: Lonie Peak, PA-C Consultants: Neurology   Indication for Hospitalization: seizure   Discharge Diagnoses/Problem List:  Seizures Hyperemesis Elevated blood pressure Hyperglycemia GERD Tobacco use Substance use   Disposition: home   Discharge Condition: medically stable   Discharge Exam:  Temp:  [97.7 F (36.5 C)-99 F (37.2 C)] 97.7 F (36.5 C) (04/13 0509) Pulse Rate:  [68-77] 77 (04/13 0509) Resp:  [15-20] 20 (04/13 0509) BP: (113-135)/(74-103) 128/74 (04/13 0509) SpO2:  [97 %-100 %] 99 % (04/13 0509) Physical Exam: General: Patient sleeping comfortably in bed, in no acute distress.  Cardiovascular: RRR, no murmurs or gallops auscultated  Respiratory: CTAB, no wheezing or rales noted Abdomen: soft, nontender, presence of active bowel sounds  Extremities: radial and distal pulses strong and equal bilaterally, no LE edema noted bilaterally Neuro: AOx4, CN 2-12 grossly intact, gross sensation intact, 5/5 UE and LE strength bilaterally, no cerebellar or focal deficits, appropriately conversational  Psych: mood appropriate, pleasant   Brief Hospital Course:  Bonnie Stone is a 45 y.o. female presenting with witnessed seizure activity lasting 45 seconds. Past medical history significant for bipolar disorder.   Witnessed seizure-like activity Bonnie Stone presented to the ED via EMS after witnessed seizure-like activity approximately 45 seconds in length. Daughter witnessed and reported to ED providers that Bonnie Stone's eyes rolled back along with movement of her arms and legs. She experienced several minutes of post-ictal state with quick return to baseline without focal  deficit. On admission, vital signs were stable. EKG and CT head unremarkable. CBC, CMP largely within normal limits with the following exceptions; glucose 112, albumin 3.2, total protein 5.9, calcium 8.5. MRI brain demonstrated no acute intracranial abnormality with multifocal hyperintense T2-weighted signal within the brainstem, which may be a sequela of chronic small vessel ischemia. Neurology consulted, ordered EEG which showed no epileptiform discharges and generalized slowing. Concern for possible encephalitis, neurology recommended lumbar puncture. Lumbar puncture successfully performed without complications. LP normal, did not denote any viral or bacterial etiology. CSF culture demonstrated no growth for 24 hours. She was started on Keppra. UDS positive only for THC, serum ETOH <10. Patient without seizure activity during her hospital stay. Patient discharged on with close PCP and [facility] follow up.   Elevated blood pressures BP on presentation 140/99. Systolics since range 120-160s. Likely dependent on patient movement. No PMHx hypertension. Patient high blood pressures resolved soon after admission remained normotensive. Patient discharged home without antihypertensive medication, will  recommend PCP follow up.   Hyperemesis  Noted to have multiple episodes of nausea and vomiting throughout hospitalization. Given zofran which resolved her symptoms, likely secondary to [facility] use. No further intervention performed.   All other issues chronic and stable.   Issues for follow up:  1. Patient would likely benefit from therapy or counseling.  2. Encourage cessation of THC as this likely caused her symptoms  3. Recheck BP to ensure patient does not need anti-hypertensive therapy as was elevated on admission.   4. Ensure patient follows up with neurology outpatient.  5. Ensure patient remains compliant on anti-epileptic therapy.  6. Ensure patient does not drive for at least 6 months  following discharge given recent seizure.   Significant Procedures:  EEG performed 4/11. LP performed 4/11.  Significant Labs and Imaging:  Recent Labs  Lab 07/14/20 0711 07/15/20 0401 07/16/20 0406  WBC 7.3 7.4 7.7  HGB 12.7 12.6 12.7  HCT 37.8 37.2 38.0  PLT 218 209 207   Recent Labs  Lab 07/13/20 1910 07/13/20 2119 07/14/20 0711 07/15/20 0401 07/16/20 0406  NA 143  --  144 141 142  K 3.8  --  3.7 4.0 4.1  CL 112*  --  109 108 109  CO2 24  --  29 28 28   GLUCOSE 84  --  90 90 78  BUN 8  --  9 12 12   CREATININE 0.62  --  0.69 0.78 0.62  CALCIUM 8.5*  --  8.7* 9.0 9.0  MG 2.0  --   --   --   --   PHOS  --  4.5  --   --   --   ALKPHOS 56  --  55  --   --   AST 22  --  15  --   --   ALT 11  --  13  --   --   ALBUMIN 3.2*  --  3.1*  --   --       Results/Tests Pending at Time of Discharge:  CSF culture, no growth thus far for 24 hours.   Discharge Medications:  Allergies as of 07/16/2020      Reactions   Bee Venom Anaphylaxis   Penicillins       Medication List    STOP taking these medications   diphenhydrAMINE 25 MG tablet Commonly known as: Benadryl Allergy   ondansetron 4 MG disintegrating tablet Commonly known as: Zofran ODT   potassium chloride SA 20 MEQ tablet Commonly known as: Klor-Con M20   predniSONE 50 MG tablet Commonly known as: DELTASONE     TAKE these medications   EPINEPHrine 0.3 mg/0.3 mL Soaj injection Commonly known as: EPI-PEN Inject 0.3 mLs (0.3 mg total) into the muscle as needed for anaphylaxis.   levETIRAcetam 500 MG tablet Commonly known as: KEPPRA Take 1 tablet (500 mg total) by mouth 2 (two) times daily.       Discharge Instructions: Please refer to Patient Instructions section of EMR for full details.  Patient was counseled important signs and symptoms that should prompt return to medical care, changes in medications, dietary instructions, activity restrictions, and follow up appointments.   Follow-Up  Appointments:  Follow-up Information    [facility]. Schedule an appointment as soon as possible for a visit.   Specialty: Neurology Why: Please make an appointment in 1 month.  Contact information: [address] [phone]       [id number], PA-C. Schedule an appointment as soon as possible for a visit.   Specialty: Physician Assistant Why: Please make an appointment within the next month for a hospital follow up.  Contact information: [address] [place] [address] [place] [phone]               [id number], DO 07/16/2020, 5:42 PM PGY-1, [facility]

## 2020-07-16 NOTE — Discharge Instructions (Signed)
You were hospitalized at [facility] due to a seizure.  We expect this is from substance use which improved after medication  We are so glad you are feeling better.  Be sure to follow-up with your regularly scheduled appointments.  Please also be sure to follow-up with your PCP  at your earliest convenience.  Thank you for allowing [place] to be a part of your medical care.  Please remember to not drive for 6 months given your seizure. Please make sure to follow up with neurology outpatient and continue taking Keppra for seizure prevention.   Take care, [facility] family medicine team   Seizure, Adult A seizure is a sudden burst of abnormal electrical and chemical activity in the brain. Seizures usually last from 30 seconds to 2 minutes.  What are the causes? Common causes of this condition include:  Fever or infection.  Problems that affect the brain. These may include: ? A brain or head injury. ? Bleeding in the brain. ? A brain tumor.  Low levels of blood sugar or salt.  Kidney problems or liver problems.  Conditions that are passed from parent to child (are inherited).  Problems with a substance, such as: ? Having a reaction to a drug or a medicine. ? Stopping the use of a substance all of a sudden (withdrawal).  A stroke.  Disorders that affect how you develop. Sometimes, the cause may not be known.  What increases the risk?  Having someone in your family who has epilepsy. In this condition, seizures happen again and again over time. They have no clear cause.  Having had a tonic-clonic seizure before. This type of seizure causes you to: ? Tighten the muscles of the whole body. ? Lose consciousness.  Having had a head injury or strokes before.  Having had a lack of oxygen at birth. What are the signs or symptoms? There are many types of seizures. The symptoms vary depending on the type of seizure you have. Symptoms during a seizure  Shaking that you cannot control  (convulsions) with fast, jerky movements of muscles.  Stiffness of the body.  Breathing problems.  Feeling mixed up (confused).  Staring or not responding to sound or touch.  Head nodding.  Eyes that blink, flutter, or move fast.  Drooling, grunting, or making clicking sounds with your mouth  Losing control of when you pee or poop. Symptoms before a seizure  Feeling afraid, nervous, or worried.  Feeling like you may vomit.  Feeling like: ? You are moving when you are not. ? Things around you are moving when they are not.  Feeling like you saw or heard something before (dj vu).  Odd tastes or smells.  Changes in how you see. You may see flashing lights or spots. Symptoms after a seizure  Feeling confused.  Feeling sleepy.  Headache.  Sore muscles. How is this treated? If your seizure stops on its own, you will not need treatment. If your seizure lasts longer than 5 minutes, you will normally need treatment. Treatment may include:  Medicines given through an IV tube.  Avoiding things, such as medicines, that are known to cause your seizures.  Medicines to prevent seizures.  A device to prevent or control seizures.  Surgery.  A diet low in carbohydrates and high in fat (ketogenic diet). Follow these instructions at home: Medicines  Take over-the-counter and prescription medicines only as told by your doctor.  Avoid foods or drinks that may keep your medicine from working,  such as alcohol. Activity  Follow instructions about driving, swimming, or doing things that would be dangerous if you had another seizure. Wait until your doctor says it is safe for you to do these things.  If you live in the [place] ask your local department of motor vehicles when you can drive.  Get a lot of rest. Teaching others  Teach friends and family what to do when you have a seizure. They should: ? Help you get down to the ground. ? Protect your head and body. ? Loosen  any clothing around your neck. ? Turn you on your side. ? Know whether or not you need emergency care. ? Stay with you until you are better.  Also, tell them what not to do if you have a seizure. Tell them: ? They should not hold you down. ? They should not put anything in your mouth.   General instructions  Avoid anything that gives you seizures.  Keep a seizure diary. Write down: ? What you remember about each seizure. ? What you think caused each seizure.  Keep all follow-up visits. Contact a doctor if:  You have another seizure or seizures. Call the doctor each time you have a seizure.  The pattern of your seizures changes.  You keep having seizures with treatment.  You have symptoms of being sick or having an infection.  You are not able to take your medicine. Get help right away if:  You have any of these problems: ? A seizure that lasts longer than 5 minutes. ? Many seizures in a row and you do not feel better between seizures. ? A seizure that makes it harder to breathe. ? A seizure and you can no longer speak or use part of your body.  You do not wake up right after a seizure.  You get hurt during a seizure.  You feel confused or have pain right after a seizure. These symptoms may be an emergency. Get help right away. Call your local emergency services (911 in the [place]  Do not wait to see if the symptoms will go away.  Do not drive yourself to the hospital. Summary  A seizure is a sudden burst of abnormal electrical and chemical activity in the brain. Seizures normally last from 30 seconds to 2 minutes.  Causes of seizures include illness, injury to the head, low levels of blood sugar or salt, and certain conditions.  Most seizures will stop on their own in less than 5 minutes. Seizures that last longer than 5 minutes are a medical emergency and need treatment right away.  Many medicines are used to treat seizures. Take over-the-counter and  prescription medicines only as told by your doctor. This information is not intended to replace advice given to you by your health care provider. Make sure you discuss any questions you have with your health care provider. Document Revised: 09/28/2019 Document Reviewed: 09/28/2019 Elsevier Patient Education  2021 [employer].

## 2020-07-16 NOTE — ED Provider Notes (Signed)
[facility] EMERGENCY DEPARTMENT Provider Note   CSN: [id number] Arrival date & time: 07/16/20  2025     History Chief Complaint  Patient presents with  . Seizures    Bonnie Stone is a 45 y.o. female.  45 yo F with a chief complaint of seizure-like activity.  The patient was just in the hospital and was evaluated for new onset seizures.  Had an MRI LP CT scan.  Had improved clinically and was discharged home.  Was sitting and watching her grandchildren play when she suddenly started having a smell and had tonic-clonic like activity.  Occurred about 30 minutes ago.  Brought back to the ED.  Complaining of a severe right-sided headache and right arm and leg pain.  The history is provided by the patient.  Seizures Illness Severity:  Severe Onset quality:  Sudden Duration:  5 minutes Timing:  Rare Progression:  Resolved Chronicity:  Recurrent Associated symptoms: headaches   Associated symptoms: no chest pain, no congestion, no fever, no myalgias, no nausea, no rhinorrhea, no shortness of breath, no vomiting and no wheezing        History reviewed. No pertinent past medical history.  Patient Active Problem List   Diagnosis Date Noted  . Seizures (HCC) 07/16/2020  . Transient neurological symptoms 07/14/2020  . Petechial rash 07/14/2020  . Convulsions (HCC)   . Seizure (HCC) 07/13/2020  . Pain in limb 02/08/2020  . Swelling of limb 02/08/2020    Past Surgical History:  Procedure Laterality Date  . CHOLECYSTECTOMY    . TUBAL LIGATION       OB History   No obstetric history on file.     Family History  Problem Relation Age of Onset  . Seizures Son        Epilepsy  . Seizures Son        Febrile seizures    Social History   Tobacco Use  . Smoking status: Current Some Day Smoker    Packs/day: 0.50    Years: 23.00    Pack years: 11.50    Types: Cigarettes  . Smokeless tobacco: Never Used  . Tobacco comment: Smoking since age of 45  years old  Substance Use Topics  . Alcohol use: Yes    Comment: Drinks once a month with husband  . Drug use: Not Currently    Types: Cocaine    Comment: Reports last use 2008 after conception of last child    Home Medications Prior to Admission medications   Medication Sig Start Date End Date Taking? Authorizing Provider  EPINEPHrine 0.3 mg/0.3 mL IJ SOAJ injection Inject 0.3 mLs (0.3 mg total) into the muscle as needed for anaphylaxis. 11/04/19   Jene Every, MD  levETIRAcetam (KEPPRA) 500 MG tablet Take 1 tablet (500 mg total) by mouth 2 (two) times daily. 07/16/20   Simmons-Robinson, Tawanna Cooler, MD    Allergies    Bee venom and Penicillins  Review of Systems   Review of Systems  Constitutional: Negative for chills and fever.  HENT: Negative for congestion and rhinorrhea.   Eyes: Negative for redness and visual disturbance.  Respiratory: Negative for shortness of breath and wheezing.   Cardiovascular: Negative for chest pain and palpitations.  Gastrointestinal: Negative for nausea and vomiting.  Genitourinary: Negative for dysuria and urgency.  Musculoskeletal: Negative for arthralgias and myalgias.  Skin: Negative for pallor and wound.  Neurological: Positive for seizures and headaches. Negative for dizziness.    Physical Exam Updated Vital Signs  BP 97/73   Pulse 63   Resp 18   LMP 03/16/2017 (Approximate)   SpO2 99%   Physical Exam Vitals and nursing note reviewed.  Constitutional:      General: She is not in acute distress.    Appearance: She is well-developed. She is not diaphoretic.  HENT:     Head: Normocephalic and atraumatic.  Eyes:     Pupils: Pupils are equal, round, and reactive to light.  Cardiovascular:     Rate and Rhythm: Normal rate and regular rhythm.     Heart sounds: No murmur heard. No friction rub. No gallop.   Pulmonary:     Effort: Pulmonary effort is normal.     Breath sounds: No wheezing or rales.  Abdominal:     General: There is no  distension.     Palpations: Abdomen is soft.     Tenderness: There is no abdominal tenderness.  Musculoskeletal:        General: No tenderness.     Cervical back: Normal range of motion and neck supple.  Skin:    General: Skin is warm and dry.  Neurological:     Mental Status: She is alert and oriented to person, place, and time.     Sensory: Sensation is intact.     Motor: Motor function is intact.     Coordination: Coordination is intact.     Comments: 4-5 muscle strength to the right upper extremity compared to left.  Subjective decrease sensation to the right side of her face.  Otherwise benign neurologic exam.  Psychiatric:        Behavior: Behavior normal.     ED Results / Procedures / Treatments   Labs (all labs ordered are listed, but only abnormal results are displayed) Labs Reviewed  CBC WITH DIFFERENTIAL/PLATELET  COMPREHENSIVE METABOLIC PANEL  URINALYSIS, ROUTINE W REFLEX MICROSCOPIC  RAPID URINE DRUG SCREEN, HOSP PERFORMED    EKG EKG Interpretation  Date/Time:  Wednesday July 16 2020 21:03:29 EDT Ventricular Rate:  78 PR Interval:  149 QRS Duration: 91 QT Interval:  373 QTC Calculation: 425 R Axis:   60 Text Interpretation: Sinus rhythm Low voltage, precordial leads No significant change since last tracing Confirmed by Melene Plan [phone]) on 07/16/2020 9:22:23 PM   Radiology No results found.  Procedures Procedures   Medications Ordered in ED Medications  levETIRAcetam (KEPPRA) IVPB 1500 mg/ 100 mL premix (has no administration in time range)  levETIRAcetam (KEPPRA) tablet 1,000 mg (has no administration in time range)  sodium chloride 0.9 % bolus 1,000 mL (1,000 mLs Intravenous New Bag/Given 07/16/20 2111)  LORazepam (ATIVAN) injection 1 mg (1 mg Intravenous Given 07/16/20 2117)  prochlorperazine (COMPAZINE) injection 10 mg (10 mg Intravenous Given 07/16/20 2111)  diphenhydrAMINE (BENADRYL) injection 25 mg (25 mg Intravenous Given 07/16/20 2115)   iohexol (OMNIPAQUE) 350 MG/ML injection 75 mL (75 mLs Intravenous Contrast Given 07/16/20 2224)    ED Course  I have reviewed the triage vital signs and the nursing notes.  Pertinent labs & imaging results that were available during my care of the patient were reviewed by me and considered in my medical decision making (see chart for details).    MDM Rules/Calculators/A&P                          45 yo F with a chief complaints of seizure-like activity headache and right-sided weakness.  Patient was just in the hospital for new onset  seizures.  I discussed case with the neurologist, Dr. Selina Cooley will come and evaluate the patient at bedside.  CTA and CTV without obvious pathology.  Stat bedside EEG being obtained by neurology currently.  Awaiting neurology evaluation and disposition recommendations.  Signed out to Dr. Blinda Leatherwood, please see his note for further details of care in the ED.   CRITICAL CARE Performed by: Rae Roam   Total critical care time: 35 minutes  Critical care time was exclusive of separately billable procedures and treating other patients.  Critical care was necessary to treat or prevent imminent or life-threatening deterioration.  Critical care was time spent personally by me on the following activities: development of treatment plan with patient and/or surrogate as well as nursing, discussions with consultants, evaluation of patient's response to treatment, examination of patient, obtaining history from patient or surrogate, ordering and performing treatments and interventions, ordering and review of laboratory studies, ordering and review of radiographic studies, pulse oximetry and re-evaluation of patient's condition.   The patients results and plan were reviewed and discussed.   Any x-rays performed were independently reviewed by myself.   Differential diagnosis were considered with the presenting HPI.  Medications  levETIRAcetam (KEPPRA) IVPB 1500  mg/ 100 mL premix (has no administration in time range)  levETIRAcetam (KEPPRA) tablet 1,000 mg (has no administration in time range)  sodium chloride 0.9 % bolus 1,000 mL (1,000 mLs Intravenous New Bag/Given 07/16/20 2111)  LORazepam (ATIVAN) injection 1 mg (1 mg Intravenous Given 07/16/20 2117)  prochlorperazine (COMPAZINE) injection 10 mg (10 mg Intravenous Given 07/16/20 2111)  diphenhydrAMINE (BENADRYL) injection 25 mg (25 mg Intravenous Given 07/16/20 2115)  iohexol (OMNIPAQUE) 350 MG/ML injection 75 mL (75 mLs Intravenous Contrast Given 07/16/20 2224)    Vitals:   07/16/20 2145 07/16/20 2322 07/16/20 2326  BP: 120/82 97/73   Pulse: 63    Resp: 18 17 18   SpO2: 99%      Final diagnoses:  Seizure (HCC)    Admission/ observation were discussed with the admitting physician, patient and/or family and they are comfortable with the plan.   Final Clinical Impression(s) / ED Diagnoses Final diagnoses:  Seizure [facility])    Rx / DC Orders ED Discharge Orders    None       [facility], DO 07/16/20 2338

## 2020-07-16 NOTE — Progress Notes (Signed)
Pt is a CT. Rn will call back at [phone] when pt Is ready

## 2020-07-16 NOTE — Progress Notes (Signed)
EEG complete - results pending 

## 2020-07-16 NOTE — Progress Notes (Signed)
CSW met with pt for substance use consult. Pt is pleasant and reports she does not have any concerns about her marijuana use and reports that she does not use anything else. When asked how often uses marijuana she states "not enough for you to come see me." She explains she smokes every "once in a while." Pt denies any other needs. CSW provides printed list of substance use tx resources.

## 2020-07-16 NOTE — Progress Notes (Signed)
Family Medicine Teaching Service Daily Progress Note Intern Pager: [phone]  Patient name: Bonnie Stone Medical record number: [id number] Date of birth: Aug 07, 1975 Age: 45 y.o. Gender: female  Primary Care Provider: Lonie Peak, PA-C Consultants: Neurology  Code Status: Full  Pt Overview and Major Events to Date:  4/1: Admitted   Assessment and Plan: Bonnie A Hilliardis a 45 y.o.femalepresenting with witnessed seizure activity lasting 45 seconds.No PMHx.   Witnessed seizure-like activity No witnessed seizure activity throughout patient's hospitalization thus far. CT head unremarkable. MRI brain no acute intracranial abnormality, multifocal hyperintense T2-weighted signal within the brainstem which may be a sequela of chronic small vessel ischemia.UDS positive for THC use, could be possible etiology of seizure-activity. EEG notable for no definite epileptiform discharge or electrographic seizures noted. TSH wnl. LP overall thus far not concerning for infectious etiology, viral or bacterial. Varicella IgG immune. -Neurology consulted, appreciate continued involvement and recommendations -Vitals and pulse ox per unit routine -Keppra 500 mg bid daily -acyclovir 640 mg q8h, consider discontinuing given overall normal LP -neuro checks -seizure precautions -pending CSF culture, no growth for 24 hrs -pending HSV PCR   Nausea and vomiting Improving, nausea and vomiting. Last vomited earlier this morning. Likely secondary to [facility] use.  -zofran 4 mg prn -encouraged THC cessation, reiterated the importance of this again   Elevated blood pressures BP 128/74. Likely resolved, no history of hypertension.  -continue to monitor BP  Elevated blood glucose Resolved. Blood glucose 90 this morning. Glucose on admission CMP 112. No PMHx diabetes or pre-diabetes. A1c 5.5.  -monitor CMP  GERD Endorses long-standing history of acid reflux.  -protonix 20 mg daily   Tobacco use 1/2  PPD user for over 30 years.  -nicotine patch as desired -encourage cessation  Substance abuse Current marijuana use, but not daily. UDS positive for THC.  -encourage cessation  -TOC consulted  FEN/GI: regular PPx: lovenox   Status is: Observation   Dispo: The patient is from: Home              Anticipated d/c is to: Home              Patient currently is not medically stable to d/c.   Difficult to place patient No        Subjective:  No acute overnight events reported. Patient asleep when I walked in the room. Endorsing very mild right-sided headache that has significantly improved. Denies associated photophobia or phonophobia. Denies other pain. Had an episode of vomiting this morning but states she is doing well otherwise. She is very agreeable to discharge if able to go home. Instructed to not drive for 6 months given seizure and to follow up with neurology outpatient.   Objective: Temp:  [97.7 F (36.5 C)-99 F (37.2 C)] 97.7 F (36.5 C) (04/13 0509) Pulse Rate:  [68-77] 77 (04/13 0509) Resp:  [15-20] 20 (04/13 0509) BP: (113-135)/(74-103) 128/74 (04/13 0509) SpO2:  [97 %-100 %] 99 % (04/13 0509) Physical Exam: General: Patient sleeping comfortably in bed, in no acute distress.  Cardiovascular: RRR, no murmurs or gallops auscultated  Respiratory: CTAB, no wheezing or rales noted Abdomen: soft, nontender, presence of active bowel sounds  Extremities: radial and distal pulses strong and equal bilaterally, no LE edema noted bilaterally Neuro: AOx4, CN 2-12 grossly intact, gross sensation intact, 5/5 UE and LE strength bilaterally, no cerebellar or focal deficits, appropriately conversational  Psych: mood appropriate, pleasant   Laboratory: Recent Labs  Lab 07/13/20 1910 07/14/20 0711 07/15/20  0401  WBC 9.6 7.3 7.4  HGB 12.7 12.7 12.6  HCT 38.2 37.8 37.2  PLT 230 218 209   Recent Labs  Lab 07/13/20 1910 07/14/20 0711 07/15/20 0401  NA 143 144 141  K  3.8 3.7 4.0  CL 112* 109 108  CO2 24 29 28   BUN 8 9 12   CREATININE 0.62 0.69 0.78  CALCIUM 8.5* 8.7* 9.0  PROT 5.9* 5.9*  --   BILITOT 0.5 0.4  --   ALKPHOS 56 55  --   ALT 11 13  --   AST 22 15  --   GLUCOSE 84 90 90      Imaging/Diagnostic Tests: No results found.  , DO 07/16/2020, 6:36 AM PGY-1, [facility] Family Medicine FPTS Intern pager: [phone], text pages welcome

## 2020-07-16 NOTE — ED Triage Notes (Addendum)
Patient with seizures, states she is not feeling right.  Patient states that has been vomiting non stop. Patient brought back to room stating she feels like she is going to have another seizure.  States she is having an aura. Patient complaining of HA.

## 2020-07-17 ENCOUNTER — Ambulatory Visit (HOSPITAL_COMMUNITY): Payer: Medicaid Other

## 2020-07-17 ENCOUNTER — Observation Stay (HOSPITAL_COMMUNITY): Payer: Medicaid Other

## 2020-07-17 DIAGNOSIS — R569 Unspecified convulsions: Secondary | ICD-10-CM

## 2020-07-17 LAB — URINALYSIS, ROUTINE W REFLEX MICROSCOPIC
Bacteria, UA: NONE SEEN
Bilirubin Urine: NEGATIVE
Glucose, UA: NEGATIVE mg/dL
Ketones, ur: NEGATIVE mg/dL
Leukocytes,Ua: NEGATIVE
Nitrite: NEGATIVE
Protein, ur: NEGATIVE mg/dL
Specific Gravity, Urine: 1.029 (ref 1.005–1.030)
pH: 7 (ref 5.0–8.0)

## 2020-07-17 LAB — RAPID URINE DRUG SCREEN, HOSP PERFORMED
Amphetamines: NOT DETECTED
Barbiturates: NOT DETECTED
Benzodiazepines: NOT DETECTED
Cocaine: POSITIVE — AB
Opiates: NOT DETECTED
Tetrahydrocannabinol: POSITIVE — AB

## 2020-07-17 LAB — CSF CULTURE W GRAM STAIN: Culture: NO GROWTH

## 2020-07-17 LAB — MISC LABCORP TEST (SEND OUT): Labcorp test code: 139835

## 2020-07-17 LAB — ETHANOL: Alcohol, Ethyl (B): <10 mg/dL (ref <10)

## 2020-07-17 LAB — MAGNESIUM: Magnesium: 1.9 mg/dL (ref 1.7–2.4)

## 2020-07-17 LAB — RESP PANEL BY RT-PCR (FLU A&B, COVID) ARPGX2
Influenza A by PCR: NEGATIVE
Influenza B by PCR: NEGATIVE
SARS Coronavirus 2 by RT PCR: NEGATIVE

## 2020-07-17 LAB — GLUCOSE, CAPILLARY: Glucose-Capillary: 91 mg/dL (ref 70–99)

## 2020-07-17 LAB — RPR: RPR Ser Ql: NONREACTIVE

## 2020-07-17 LAB — VARICELLA-ZOSTER BY PCR

## 2020-07-17 LAB — FOLATE: Folate: 14 ng/mL (ref >5.9)

## 2020-07-17 LAB — VITAMIN B12: Vitamin B-12: 703 pg/mL (ref 180–914)

## 2020-07-17 MED ORDER — ONDANSETRON HCL 4 MG PO TABS: 4.0000 mg | ORAL_TABLET | Freq: Four times a day (QID) | ORAL | Status: DC | PRN

## 2020-07-17 MED ORDER — POLYETHYLENE GLYCOL 3350 17 G PO PACK: 17.0000 g | PACK | Freq: Every day | ORAL | Status: DC | PRN

## 2020-07-17 MED ORDER — LORAZEPAM 2 MG/ML IJ SOLN
1.0000 mg | Freq: Once | INTRAMUSCULAR | Status: AC | PRN
  Administered 2020-07-17: 1 mg via INTRAVENOUS
  Filled 2020-07-17: qty 1

## 2020-07-17 MED ORDER — NICOTINE 14 MG/24HR TD PT24
14.0000 mg | MEDICATED_PATCH | Freq: Every day | TRANSDERMAL | Status: DC
  Administered 2020-07-17 – 2020-07-18 (×2): 14 mg via TRANSDERMAL
  Filled 2020-07-17 (×2): qty 1

## 2020-07-17 MED ORDER — ACETAMINOPHEN 325 MG PO TABS
650.0000 mg | ORAL_TABLET | Freq: Four times a day (QID) | ORAL | Status: DC | PRN
  Administered 2020-07-17 – 2020-07-18 (×2): 650 mg via ORAL
  Filled 2020-07-17 (×2): qty 2

## 2020-07-17 MED ORDER — SODIUM CHLORIDE 0.9 % IV SOLN: 250.0000 mL | INTRAVENOUS | Status: DC | PRN

## 2020-07-17 MED ORDER — SODIUM CHLORIDE 0.9% FLUSH: 3.0000 mL | INTRAVENOUS | Status: DC | PRN

## 2020-07-17 MED ORDER — SODIUM CHLORIDE 0.9% FLUSH
3.0000 mL | Freq: Two times a day (BID) | INTRAVENOUS | Status: DC
  Administered 2020-07-17 – 2020-07-18 (×4): 3 mL via INTRAVENOUS

## 2020-07-17 MED ORDER — KETOROLAC TROMETHAMINE 30 MG/ML IJ SOLN: 30.0000 mg | Freq: Four times a day (QID) | INTRAMUSCULAR | Status: DC | PRN

## 2020-07-17 MED ORDER — ONDANSETRON HCL 4 MG/2ML IJ SOLN
4.0000 mg | Freq: Four times a day (QID) | INTRAMUSCULAR | Status: DC | PRN
  Administered 2020-07-17 – 2020-07-18 (×3): 4 mg via INTRAVENOUS
  Filled 2020-07-17 (×3): qty 2

## 2020-07-17 MED ORDER — PANTOPRAZOLE SODIUM 20 MG PO TBEC
20.0000 mg | DELAYED_RELEASE_TABLET | Freq: Every day | ORAL | Status: DC
  Administered 2020-07-17 – 2020-07-18 (×2): 20 mg via ORAL
  Filled 2020-07-17 (×3): qty 1

## 2020-07-17 MED ORDER — IBUPROFEN 200 MG PO TABS: 600.0000 mg | ORAL_TABLET | Freq: Four times a day (QID) | ORAL | Status: DC | PRN

## 2020-07-17 MED ORDER — ENOXAPARIN SODIUM 40 MG/0.4ML ~~LOC~~ SOLN
40.0000 mg | SUBCUTANEOUS | Status: DC
  Administered 2020-07-17 – 2020-07-18 (×2): 40 mg via SUBCUTANEOUS
  Filled 2020-07-17 (×2): qty 0.4

## 2020-07-17 NOTE — Hospital Course (Addendum)
Seizure GERD Substance abuse  Right calf tenderness  Bonnie Stone is a 45 y.o. female presenting with repeat seizure at home on day of discharge for same problem. No PMHx.    Witnessed seizure-like activity Presented with witnessed seizure-like activity after being discharged less than a day for possible seizure activity. CT head, CT venogram and CTA head and neck all unremarkable. UDS positive for THC on last admission as well but new finding includes now positive for cocaine use. Ethanol <10. B12 and folate wnl. Neurology consulted. Routine EEG performed demonstrated mild diffuse slowing with no epileptiform abnormality. Continuous EEG thus far suggestive of mild to moderate diffuse encephalopathy of nonspecific etiology, excessive beta activity likely secondary to benzos, no epileptiform discharge or seizures noted thus far although noted to have about 4 episodes of possible seizure-like activity. Neurological exam normal without focal findings. Patient discharged on Keppra 500 mg bid since last admission, neurology increased to 1000 mg bid daily. Prior to discharge, Keppra discontinued and started on depakote 500 mg daily which patient discharged on. Instructed to follow up with neurology outpatient in 1-2 months and PCP follow up in 1-2 weeks. Instructed to also not drive for 6 months.   Right calf tenderness Patient complained of right inguinal pain and right calf tenderness following possible witnessed seizure while in the hospital. On exam, noted to have bilateral lymph nodes along the inguinal region that only elicted tenderness on palpation of the right side along with right calf. Concern for DVT, ultrasound performed was negative for DVT.   All other issues chronic and stable.   Issues for Hosptial Follow up:  Recommend patient have outpatient follow up with psychiatry as outpatient due to concern for underlying mood disorder as well as illicit drug use.  F/u with PCP for bilateral  iguinal LAD that improved during hospitalization. Elevated diastolic blood pressure, recheck.  Ensure patient follows up with outpatient neurology in 1-2 months following discharge. Continue to encourage cessation of substance use. Ensure compliance with depakote per neurology recommendations.

## 2020-07-17 NOTE — ED Notes (Signed)
Kami RN notified this RN that when pt came in she refused to take off shirt in front of staff. When pt did get undressed a bag of unknown white substance/powder fell out of pts bra. Pt husband grabbed bag and left the room. MD was notified.

## 2020-07-17 NOTE — ED Notes (Signed)
Admit MD at bedside

## 2020-07-17 NOTE — ED Notes (Signed)
Attempt report x1  

## 2020-07-17 NOTE — Progress Notes (Signed)
Family Medicine Teaching Service Daily Progress Note Intern Pager: [phone]  Patient name: Bonnie Stone Medical record number: [id number] Date of birth: 1975/06/13 Age: 45 y.o. Gender: female  Primary Care Provider: Ailene Ravel, MD Consultants: Neurology  Code Status: Full  Pt Overview and Major Events to Date:  4/14: Admitted   Assessment and Plan: Bonnie Stone is a 45 y.o. female presenting with repeat seizure at home on day of discharge for same problem. No PMHx.   Witnessed seizure-like activity Recently admitted for seizure after being discharged on 4/13. Extensive workup completed. Reported having witnessed seizure similar to last one where she smelled ammonia and experienced a right-sided headache prior to her seizure. CT head, CT venogram and CTA head and neck all unremarkable. UDS positive for THC on last admission as well but new finding includes now positive for cocaine use. Ethanol <10. B12 and folate wnl. Neurology consulted, routine EEG performed demonstrated mild diffuse slowing with no epileptiform abnormality. Recommended to perform overnight EEG, pending. Denies headache and other associated symptoms this morning. Neurological exam normal without focal findings.  -Neurology following, appreciate continued involvement and recs  -Keppra 1000 mg bid daily  -overnight EEG -Vitals per unit routine -Tylenol 650 mg every 6 as needed -Ibuprofen 600 mg every 6 as needed -Zofran 4 mg every 6 as needed -awaiting RPR -pending serum drugs of abuse panel -consider consult psychiatry   GERD Endorsed brief abdominal pain after deep palpation which resolved prior to my departure.  -Protonix 20 mg daily  Substance abuse Current THC use. UDS in ED was positive for marijuana and cocaine. TOC previously consulted on last admission and spoke with patient. -Nicotine patch as desired  -Follow-up blood drug screen  FEN/GI: regular diet  PPx: lovenox    Status is:  Observation  The patient remains OBS appropriate and will d/c before 2 midnights.  Dispo: The patient is from: Home              Anticipated d/c is to: Home              Patient currently is not medically stable to d/c.   Difficult to place patient No        Subjective:  Patient reports that she recalls going up the stairs and seeing her grandchildren play and the next thing after that remembers her husband driving her to the hospital. Denies current headache, vision changes and other symptoms.   Objective: Temp:  [98 F (36.7 C)-98.9 F (37.2 C)] 98.1 F (36.7 C) (04/14 0645) Pulse Rate:  [59-73] 73 (04/14 0645) Resp:  [14-21] 18 (04/14 0645) BP: (97-148)/(72-102) 135/84 (04/14 0645) SpO2:  [96 %-100 %] 96 % (04/14 0645) Physical Exam: General: Patient laying in bed comfortably with EEG monitoring and leads placed, in no acute distress. Cardiovascular: RRR, no murmurs or gallops auscultated  Respiratory: CTAB, breathing comfortably on room air  Abdomen: soft, nontender, presence of active bowel sounds Extremities: no LE edema noted bilaterally, radial and distal pulses strong and equal bilaterally Neuro: AOx4, CN2-12 grossly intact although endorses sharper hearing on the right side, sensation grossly intact although endorses sharper sensation along the right facial exam, 5/5 UE and LE strength bilaterally, no pronator drift noted, normal cerebellar testing and finger to nose testing Psych: mood appropriate   Laboratory: Recent Labs  Lab 07/15/20 0401 07/16/20 0406 07/16/20 2033  WBC 7.4 7.7 9.1  HGB 12.6 12.7 14.1  HCT 37.2 38.0 41.3  PLT 209 207 224  Recent Labs  Lab 07/13/20 1910 07/14/20 0711 07/15/20 0401 07/16/20 0406 07/16/20 2033  NA 143 144 141 142 141  K 3.8 3.7 4.0 4.1 3.7  CL 112* 109 108 109 104  CO2 BUN CREATININE 0.62 0.69 0.78 0.62 0.80  CALCIUM 8.5* 8.7* 9.0 9.0 9.6  PROT 5.9* 5.9*  --   --  7.0  BILITOT 0.5  0.4  --   --  0.5  ALKPHOS 56 55  --   --  57  ALT 11 13  --   --  15  AST 22 15  --   --  21  GLUCOSE 84 90 90 78 92      Imaging/Diagnostic Tests: CT Angio Head W or Wo Contrast  Result Date: 07/16/2020 CLINICAL DATA:  Initial evaluation for acute neuro deficit, stroke suspected, seizure. EXAM: CT ANGIOGRAPHY HEAD AND NECK CT VENOGRAM HEAD TECHNIQUE: Multidetector CT imaging of the head and neck was performed using the standard protocol during bolus administration of intravenous contrast. Multiplanar CT image reconstructions and MIPs were obtained to evaluate the vascular anatomy. Carotid stenosis measurements (when applicable) are obtained utilizing NASCET criteria, using the distal internal carotid diameter as the denominator. CONTRAST:  75mL OMNIPAQUE IOHEXOL 350 MG/ML SOLN COMPARISON:  Prior CT and MRI from 07/13/2020. FINDINGS: CT HEAD FINDINGS Brain: Cerebral volume within normal limits for patient age. No evidence for acute intracranial hemorrhage. No findings to suggest acute large vessel territory infarct. No mass lesion, midline shift, or mass effect. Ventricles are normal in size without evidence for hydrocephalus. No extra-axial fluid collection identified. Vascular: No hyperdense vessel identified. Skull: Scalp soft tissues demonstrate no acute abnormality. Calvarium intact. Sinuses/Orbits: Globes and orbital soft tissues within normal limits. Mild scattered mucosal thickening noted within the ethmoidal air cells. Paranasal sinuses are otherwise clear. No mastoid effusion. CTA NECK FINDINGS Aortic arch: Visualized aortic arch normal caliber with normal 3 vessel morphology. No hemodynamically significant stenosis seen about the origin of the great vessels. Visualized subclavian arteries widely patent. Right carotid system: Right common and internal carotid arteries widely patent without stenosis, dissection or occlusion. Left carotid system: Left common and internal carotid arteries widely  patent without stenosis, dissection or occlusion. Vertebral arteries: Both vertebral arteries arise from the subclavian arteries. No proximal subclavian artery stenosis. Both vertebral arteries widely patent without stenosis, dissection or occlusion. Skeleton: No acute osseous abnormality. No discrete or worrisome osseous lesions. Other neck: No other acute soft tissue abnormality within the neck. No mass or adenopathy. Upper chest: No other acute abnormality within the visualized upper chest. Centrilobular emphysema noted. Review of the MIP images confirms the above findings CTA HEAD FINDINGS Anterior circulation: Both internal carotid arteries widely patent to the termini without stenosis. A1 segments widely patent. Normal anterior communicating artery complex. Both anterior cerebral arteries widely patent to their distal aspects without stenosis. No M1 stenosis or occlusion. Normal MCA bifurcations. Distal MCA branches well perfused and symmetric. Posterior circulation: Both V4 segments patent to the vertebrobasilar junction without stenosis. Both PICA origins patent and normal. Basilar widely patent to its distal aspect without stenosis. Superior cerebellar arteries patent bilaterally. Both PCAs primarily supplied via the basilar and are well perfused to there distal aspects. Venous sinuses: Normal enhancement seen throughout the superior sagittal sinus to the level of the torcula. Torcula itself is patent. Transverse and sigmoid sinuses appear patent as do the proximal internal jugular veins. Straight sinus, vein  of Galen, internal cerebral veins, and basal veins of Rosenthal appear patent. No evidence for dural sinus thrombosis. Anatomic variants: None significant.  No intracranial aneurysm. Review of the MIP images confirms the above findings IMPRESSION: CT HEAD IMPRESSION: Normal head CT.  No acute intracranial abnormality. CTA HEAD AND NECK IMPRESSION: Normal CTA of the head and neck. No large vessel  occlusion, hemodynamically significant stenosis, or other acute vascular abnormality. No aneurysm. CT VENOGRAM IMPRESSION: Normal CT venogram.  No evidence for dural sinus thrombosis. Electronically Signed   By: Rise Mu M.D.   On: 07/16/2020 23:08   CT Angio Neck W and/or Wo Contrast  Result Date: 07/16/2020 CLINICAL DATA:  Initial evaluation for acute neuro deficit, stroke suspected, seizure. EXAM: CT ANGIOGRAPHY HEAD AND NECK CT VENOGRAM HEAD TECHNIQUE: Multidetector CT imaging of the head and neck was performed using the standard protocol during bolus administration of intravenous contrast. Multiplanar CT image reconstructions and MIPs were obtained to evaluate the vascular anatomy. Carotid stenosis measurements (when applicable) are obtained utilizing NASCET criteria, using the distal internal carotid diameter as the denominator. CONTRAST:  35mL OMNIPAQUE IOHEXOL 350 MG/ML SOLN COMPARISON:  Prior CT and MRI from 07/13/2020. FINDINGS: CT HEAD FINDINGS Brain: Cerebral volume within normal limits for patient age. No evidence for acute intracranial hemorrhage. No findings to suggest acute large vessel territory infarct. No mass lesion, midline shift, or mass effect. Ventricles are normal in size without evidence for hydrocephalus. No extra-axial fluid collection identified. Vascular: No hyperdense vessel identified. Skull: Scalp soft tissues demonstrate no acute abnormality. Calvarium intact. Sinuses/Orbits: Globes and orbital soft tissues within normal limits. Mild scattered mucosal thickening noted within the ethmoidal air cells. Paranasal sinuses are otherwise clear. No mastoid effusion. CTA NECK FINDINGS Aortic arch: Visualized aortic arch normal caliber with normal 3 vessel morphology. No hemodynamically significant stenosis seen about the origin of the great vessels. Visualized subclavian arteries widely patent. Right carotid system: Right common and internal carotid arteries widely patent  without stenosis, dissection or occlusion. Left carotid system: Left common and internal carotid arteries widely patent without stenosis, dissection or occlusion. Vertebral arteries: Both vertebral arteries arise from the subclavian arteries. No proximal subclavian artery stenosis. Both vertebral arteries widely patent without stenosis, dissection or occlusion. Skeleton: No acute osseous abnormality. No discrete or worrisome osseous lesions. Other neck: No other acute soft tissue abnormality within the neck. No mass or adenopathy. Upper chest: No other acute abnormality within the visualized upper chest. Centrilobular emphysema noted. Review of the MIP images confirms the above findings CTA HEAD FINDINGS Anterior circulation: Both internal carotid arteries widely patent to the termini without stenosis. A1 segments widely patent. Normal anterior communicating artery complex. Both anterior cerebral arteries widely patent to their distal aspects without stenosis. No M1 stenosis or occlusion. Normal MCA bifurcations. Distal MCA branches well perfused and symmetric. Posterior circulation: Both V4 segments patent to the vertebrobasilar junction without stenosis. Both PICA origins patent and normal. Basilar widely patent to its distal aspect without stenosis. Superior cerebellar arteries patent bilaterally. Both PCAs primarily supplied via the basilar and are well perfused to there distal aspects. Venous sinuses: Normal enhancement seen throughout the superior sagittal sinus to the level of the torcula. Torcula itself is patent. Transverse and sigmoid sinuses appear patent as do the proximal internal jugular veins. Straight sinus, vein of Galen, internal cerebral veins, and basal veins of Rosenthal appear patent. No evidence for dural sinus thrombosis. Anatomic variants: None significant.  No intracranial aneurysm. Review of the MIP  images confirms the above findings IMPRESSION: CT HEAD IMPRESSION: Normal head CT.  No  acute intracranial abnormality. CTA HEAD AND NECK IMPRESSION: Normal CTA of the head and neck. No large vessel occlusion, hemodynamically significant stenosis, or other acute vascular abnormality. No aneurysm. CT VENOGRAM IMPRESSION: Normal CT venogram.  No evidence for dural sinus thrombosis. Electronically Signed   By: Rise Mu M.D.   On: 07/16/2020 23:08   CT VENOGRAM HEAD  Result Date: 07/16/2020 CLINICAL DATA:  Initial evaluation for acute neuro deficit, stroke suspected, seizure. EXAM: CT ANGIOGRAPHY HEAD AND NECK CT VENOGRAM HEAD TECHNIQUE: Multidetector CT imaging of the head and neck was performed using the standard protocol during bolus administration of intravenous contrast. Multiplanar CT image reconstructions and MIPs were obtained to evaluate the vascular anatomy. Carotid stenosis measurements (when applicable) are obtained utilizing NASCET criteria, using the distal internal carotid diameter as the denominator. CONTRAST:  59mL OMNIPAQUE IOHEXOL 350 MG/ML SOLN COMPARISON:  Prior CT and MRI from 07/13/2020. FINDINGS: CT HEAD FINDINGS Brain: Cerebral volume within normal limits for patient age. No evidence for acute intracranial hemorrhage. No findings to suggest acute large vessel territory infarct. No mass lesion, midline shift, or mass effect. Ventricles are normal in size without evidence for hydrocephalus. No extra-axial fluid collection identified. Vascular: No hyperdense vessel identified. Skull: Scalp soft tissues demonstrate no acute abnormality. Calvarium intact. Sinuses/Orbits: Globes and orbital soft tissues within normal limits. Mild scattered mucosal thickening noted within the ethmoidal air cells. Paranasal sinuses are otherwise clear. No mastoid effusion. CTA NECK FINDINGS Aortic arch: Visualized aortic arch normal caliber with normal 3 vessel morphology. No hemodynamically significant stenosis seen about the origin of the great vessels. Visualized subclavian arteries  widely patent. Right carotid system: Right common and internal carotid arteries widely patent without stenosis, dissection or occlusion. Left carotid system: Left common and internal carotid arteries widely patent without stenosis, dissection or occlusion. Vertebral arteries: Both vertebral arteries arise from the subclavian arteries. No proximal subclavian artery stenosis. Both vertebral arteries widely patent without stenosis, dissection or occlusion. Skeleton: No acute osseous abnormality. No discrete or worrisome osseous lesions. Other neck: No other acute soft tissue abnormality within the neck. No mass or adenopathy. Upper chest: No other acute abnormality within the visualized upper chest. Centrilobular emphysema noted. Review of the MIP images confirms the above findings CTA HEAD FINDINGS Anterior circulation: Both internal carotid arteries widely patent to the termini without stenosis. A1 segments widely patent. Normal anterior communicating artery complex. Both anterior cerebral arteries widely patent to their distal aspects without stenosis. No M1 stenosis or occlusion. Normal MCA bifurcations. Distal MCA branches well perfused and symmetric. Posterior circulation: Both V4 segments patent to the vertebrobasilar junction without stenosis. Both PICA origins patent and normal. Basilar widely patent to its distal aspect without stenosis. Superior cerebellar arteries patent bilaterally. Both PCAs primarily supplied via the basilar and are well perfused to there distal aspects. Venous sinuses: Normal enhancement seen throughout the superior sagittal sinus to the level of the torcula. Torcula itself is patent. Transverse and sigmoid sinuses appear patent as do the proximal internal jugular veins. Straight sinus, vein of Galen, internal cerebral veins, and basal veins of Rosenthal appear patent. No evidence for dural sinus thrombosis. Anatomic variants: None significant.  No intracranial aneurysm. Review of the  MIP images confirms the above findings IMPRESSION: CT HEAD IMPRESSION: Normal head CT.  No acute intracranial abnormality. CTA HEAD AND NECK IMPRESSION: Normal CTA of the head and neck. No large vessel occlusion,  hemodynamically significant stenosis, or other acute vascular abnormality. No aneurysm. CT VENOGRAM IMPRESSION: Normal CT venogram.  No evidence for dural sinus thrombosis. Electronically Signed   By: Rise MuBenjamin  McClintock M.D.   On: 07/16/2020 23:08    Reece LeaderGanta, Rayner Erman, DO 07/17/2020, 7:24 AM PGY-1, [facility] FPTS Intern pager: [phone], text pages welcome

## 2020-07-17 NOTE — Procedures (Addendum)
Patient Name: Bonnie Stone  MRN: [id number]  Epilepsy Attending: Charlsie Quest  Referring Physician/Provider: Dr Fayette Pho Duration: 07/16/2020 2332 to 07/17/2020 2332  Patient history:  45 y.o. female with a history of new onset seizure who is undergoing an EEG to evaluate for seizures.   Level of alertness: Awake, asleep  AEDs during EEG study: LEV  Technical aspects: This EEG study was done with scalp electrodes positioned according to the 10-20 International system of electrode placement. Electrical activity was acquired at a sampling rate of 500Hz  and reviewed with a high frequency filter of 70Hz  and a low frequency filter of 1Hz . EEG data were recorded continuously and digitally stored.   Description: During awake state, no clear posterior dominant rhythm was seen.  Sleep was characterized by vertex waves, sleep spindles (12 to 14 Hz), maximal frontocentral region.  EEG showed continuous generalized 3 to 6 Hz theta, delta slowing.  There is also an excessive amount of 15 to 18 Hz,  beta activity distributed symmetrically and diffusely.  Hyperventilation and photic stimulation were not performed.     Event button was pressed on 07/17/2020 at 1228 during which patient was unresponsive for a few seconds followed by returning to AAO x4 and reported feeling hot, severe headache, crying and dry heaving.  Concomitant EEG before, during and after the event did not show any EEG changes suggest seizure.  Event button was pressed on 07/17/2020 at 1320, 1328 during which patient had right hand nonrhythmic twitching/tremor-like movement. Concomitant EEG before, during and after the event did not show any EEG changes suggest seizure.   ABNORMALITY - Continuous slow, generalized - Excessive beta, generalized  IMPRESSION: This study is suggestive of mild to moderate diffuse encephalopathy, nonspecific etiology. The excessive beta activity seen in the background is most likely due to the effect  of medications like benzodiazepine and is a benign EEG pattern. No seizures or epileptiform discharges were seen throughout the recording.  Multiple events were captured as described above without concomitant eeg change and were NOT epileptic.   Nimai Burbach 

## 2020-07-17 NOTE — ED Notes (Signed)
Pt placed on Purewick 

## 2020-07-17 NOTE — H&P (Addendum)
Family Medicine Teaching [facility] Admission History and Physical Service Pager: [phone]  Patient name: Bonnie Stone Medical record number: [id number] Date of birth: 1976-03-22 Age: 45 y.o. Gender: female  Primary Care Provider: Ailene Ravel, MD Consultants: Neurology Code Status: Full  Preferred Emergency Contact: Husband Bonnie Stone [phone]  Chief Complaint: Seizure  Assessment and Plan: Bonnie Stone is a 45 y.o. female presenting with repeat seizure at home on day of discharge for same problem. No PMHx.   Witnessed seizure-like activity Admitted 4/10-13 for new onset seizures, underwent extensive work-up including head CT, brain MRI, lumbar puncture.  Had improved clinically and was discharged with neurology follow-up.  Patient was home for handful of hours and experienced another bout of seizure-like activity; reported smelling ammonia beforehand and having tonic-clonic like activity.  In ED, patient complaining of severe right sided headache along with right arm and right leg pain.  Vital signs stable on admission, patient afebrile.  CBC and CMP unremarkable.  UDS positive for cocaine and THC. Neurology consulted by ED physician, patient given Keppra load and placed on overnight EEG with video.  Also obtained additional imaging; CT head, CTA head and neck, and CT venogram are all normal and without acute abnormality. Of note, nurse tells me that when patient was being triaged in ED, a small plastic bag with "what appeared to be drugs" fell out of patient's bra; the husband scooped up the small bag, ran out of the room, and returned to the ED room sometime later. Differential diagnosis includes epilepsy, illicit substance use, substance withdrawal, or conversion disorder. -Admit to MedSurg with Dr. Lum Stone attending -Neurology consulted, appreciate recommendations -Will consult psychiatry with phone call in morning (order already in place) -Keppra 1000 mg twice  daily per neuro -Overnight EEG with video per neuro -Vitals per unit routine -Tylenol 650 mg every 6 as needed -Ibuprofen 600 mg every 6 as needed -Zofran 4 mg every 6 as needed -Awaiting blood ethanol level -Ordered serum drugs of abuse panel (send out lab with 2-3 day turn around) -Follow-up a.m. B12, folate, magnesium  Acid reflux Protonix 20 mg daily while inpatient.  Substance abuse Current tobacco use, 15-pack-year history.  Rare alcohol use.  Current marijuana use during 1 week each month.  UDS in ED was positive for marijuana and cocaine despite pt's insistence she has not used cocaine in 14 years.   -Nicotine placement therapy -Follow-up blood drug screen -  F/u ethanol level -Was seen by social work for substance use resources last admission earlier this week.   FEN/GI: Regular diet, MiraLAX daily as needed Prophylaxis: Lovenox  Disposition: Med surg  History of Present Illness:  Bonnie Stone is a 45 y.o. female presenting with repeat seizure.   Admitted 4/10-13 for new onset seizures, underwent extensive work-up including head CT, brain MRI, lumbar puncture.  Had improved clinically and was discharged with neurology follow-up.  Patient was home for handful of hours and experienced another bout of seizure-like activity; reported smelling ammonia beforehand and having tonic-clonic like activity.  In ED, patient complaining of severe right sided headache along with right arm and right leg pain.  Of note, nurse tells me that when patient was being triaged in ED, a small plastic bag with "what appeared to be drugs" fell out of patient's bra; the husband scooped up the baggie, ran out of the room, and returned to the ED room sometime later.  On admission exam, patient is sleepy but can tell me short  answers before falling back asleep.  I asked if anything had changed, including medications, pets, or people in the home.  She denies any other changes.  Says all she remembers is  that she was going out to the porch to watch the grandbabies play the yard.  She is not really sure what happened after that.  At her last admission, she admitted to smoking marijuana with her husband when he was home from work 1 week out of each month.  I asked her today if she had obtained her marijuana from many different sources than normal or noticed anything different about it.  She says that nothing about where or how she obtained the marijuana had changed, but that her husband had flushed all the marijuana down the drain when he came home this most recent time because he was scared about her seizures.  Review Of Systems: Per HPI with the following additions:   Review of Systems   Patient Active Problem List   Diagnosis Date Noted  . Seizures (HCC) 07/16/2020  . Transient neurological symptoms 07/14/2020  . Petechial rash 07/14/2020  . Convulsions (HCC)   . Seizure (HCC) 07/13/2020  . Pain in limb 02/08/2020  . Swelling of limb 02/08/2020    Past Medical History: History reviewed. No pertinent past medical history.  Past Surgical History: Past Surgical History:  Procedure Laterality Date  . CHOLECYSTECTOMY    . TUBAL LIGATION      Social History: Social History   Tobacco Use  . Smoking status: Current Some Day Smoker    Packs/day: 0.50    Years: 23.00    Pack years: 11.50    Types: Cigarettes  . Smokeless tobacco: Never Used  . Tobacco comment: Smoking since age of 45 years old  Substance Use Topics  . Alcohol use: Yes    Comment: Drinks once a month with husband  . Drug use: Not Currently    Types: Cocaine    Comment: Reports last use 2008 after conception of last child   Additional social history: None Please also refer to relevant sections of EMR.  Family History: Family History  Problem Relation Age of Onset  . Seizures Son        Epilepsy  . Seizures Son        Febrile seizures    Allergies and Medications: Allergies  Allergen Reactions  . Bee  Venom Anaphylaxis  . Penicillins    No current facility-administered medications on file prior to encounter.   Current Outpatient Medications on File Prior to Encounter  Medication Sig Dispense Refill  . levETIRAcetam (KEPPRA) 500 MG tablet Take 1 tablet (500 mg total) by mouth 2 (two) times daily. 60 tablet 0  . EPINEPHrine 0.3 mg/0.3 mL IJ SOAJ injection Inject 0.3 mLs (0.3 mg total) into the muscle as needed for anaphylaxis. 1 each 1    Objective: BP 122/87   Pulse (!) 59   Temp 98 F (36.7 C) (Axillary)   Resp 20   LMP 03/16/2017 (Approximate)   SpO2 97%  Exam: General: Sleepy, arousable, no acute distress, EEG in place Cardiovascular: Regular rate and rhythm, no murmur appreciated Respiratory: Clear to auscultation bilaterally Neuro: Even smile, even grip strength bilaterally, BUE strength limited by effort but equal bilaterally Psych: Appears to have normal insight and judgment  Labs and Imaging: CBC BMET  Recent Labs  Lab 07/16/20 2033  WBC 9.1  HGB 14.1  HCT 41.3  PLT 224   Recent Labs  Lab  07/16/20 2033  NA 141  K 3.7  CL 104  CO2 30  BUN 7  CREATININE 0.80  GLUCOSE 92  CALCIUM 9.6     EKG: None  CT HEAD IMPRESSION: Normal head CT.  No acute intracranial abnormality.  CTA HEAD AND NECK IMPRESSION: Normal CTA of the head and neck. No large vessel occlusion, hemodynamically significant stenosis, or other acute vascular abnormality. No aneurysm.  CT VENOGRAM IMPRESSION: Normal CT venogram.  No evidence for dural sinus thrombosis.   Fayette Pho, MD 07/17/2020, 5:06 AM PGY-1,  Family Medicine FPTS Intern pager: [phone], text pages welcome  Resident Addendum I have separately seen and examined the patient.  I have discussed the findings and exam with the resident and agree with the above note.  I helped develop the management plan that is described in the resident's note and I agree with the content.    Lenor Coffin, MD PGY-3  [facility] residency program

## 2020-07-17 NOTE — Progress Notes (Addendum)
Pt and family report no events, and that pt has been sleeping this afternoon. Pt now eating and drinking. Reports fatigue, no complaint of pain. Will continue to monitor.

## 2020-07-17 NOTE — Plan of Care (Signed)
  Problem: Health Behavior/Discharge Planning: Goal: Ability to manage health-related needs will improve Outcome: Progressing   Problem: Clinical Measurements: Goal: Will remain free from infection Outcome: Progressing   Problem: Coping: Goal: Level of anxiety will decrease Outcome: Progressing   Problem: Elimination: Goal: Will not experience complications related to bowel motility Outcome: Progressing Goal: Will not experience complications related to urinary retention Outcome: Progressing   Problem: Pain Managment: Goal: General experience of comfort will improve Outcome: Progressing   Problem: Safety: Goal: Ability to remain free from injury will improve Outcome: Progressing   Problem: Skin Integrity: Goal: Risk for impaired skin integrity will decrease Outcome: Progressing   Problem: Education: Goal: Expressions of having a comfortable level of knowledge regarding the disease process will increase Outcome: Progressing   Problem: Coping: Goal: Ability to adjust to condition or change in health will improve Outcome: Progressing Goal: Ability to identify appropriate support needs will improve Outcome: Progressing   Problem: Health Behavior/Discharge Planning: Goal: Compliance with prescribed medication regimen will improve Outcome: Progressing   Problem: Medication: Goal: Risk for medication side effects will decrease Outcome: Progressing   Problem: Clinical Measurements: Goal: Complications related to the disease process, condition or treatment will be avoided or minimized Outcome: Progressing Goal: Diagnostic test results will improve Outcome: Progressing   Problem: Safety: Goal: Verbalization of understanding the information provided will improve Outcome: Progressing   Problem: Self-Concept: Goal: Level of anxiety will decrease Outcome: Progressing Goal: Ability to verbalize feelings about condition will improve Outcome: Progressing

## 2020-07-17 NOTE — Progress Notes (Signed)
FPTS Interim Progress Note  S: Night team evaluated Bonnie Stone. Patient in bed with EEG tech at bedside. Patient reports last "episode" was on the toilet earlier with nurse at bedside. Some pain in right inguinal region. Dull right sided headache unchanged from earlier.   Asked her about the cocaine and THC found in her UDS. She reports no intentional cocaine use when she got home. Did not even smoke any tobacco cigarettes when she got home, was very proud of herself. She is wanting to quit cigarettes. She does remember smoking a marijuana cigarette. Started tearing up when resident asked her if anything changed about the marijuana she used - source, strain, strength, etc. Patient became tearful and relayed that she got the marijuana cigarette from her daughter's boyfriend. Says this is who she normally obtains her marijuana from. She said he wouldn't tell her if anything was different about the marijuana even if she asked.   O: BP 117/77 (BP Location: Left Arm)   Pulse 77   Temp 97.8 F (36.6 C) (Axillary)   Resp 18   LMP 03/16/2017 (Approximate)   SpO2 96%   Gen: awake, alert, oriented, intermittently tearful Resp: unlabored respirations, no respiratory distress Lymph: palpable bilateral inguinal nodes, worse on right (2 discrete nodes)  A/P: - continue overnight EEG with video per neuro - vitals per unit routine - awaiting lab results, including serum tox screen - appreciate ongoing care from neuro  Fayette Pho, MD 07/17/2020, 9:05 PM PGY-1, [facility] Family Medicine Service pager [phone]

## 2020-07-17 NOTE — Progress Notes (Signed)
FPTS Interim Progress Note  S: Notified that patient was having a seizure through secure message. Presented to bedside almost immediately prior to primary team receiving a page regarding this. When I presented to bedside, patient was no longer seizing. Daughter at bedside who states that patient was witnessed to have 4 seizures within the span of a 40 minute period. Primary team was not notified of any of these until after the 4th one which I presented to bedside immediately after being notified. Per daughter, first seizure was just her moving her fingers bilaterally, while all the others consisted of right arm jerking. This is different from her previous two seizures at home that involved generalized jerking movements. Each episode lasted a few minutes with the last one completed 1 minute prior to my arrival. Patient states that she does not recall any of this but does recall experiencing a right-sided headache prior to her seizures along with smelling ammonia which she endorses is consistent with her previous seizures. Also endorsing groin pain, more prominently along right side. Patient is concerned because she will not be allowed to be alone with her grandchild if she does not get this resolved, for this she is concerned.   O: BP 112/77 (BP Location: Left Arm)   Pulse 70   Temp 98.3 F (36.8 C) (Oral)   Resp 18   LMP 03/16/2017 (Approximate)   SpO2 94%   General: Patient laying comfortably in bed, in no acute distress. CV: RRR, no murmurs or gallops auscultated Resp: CTAB, no wheezing noted Abdomen: soft, nontender, BS+ MSK: tenderness along right inguinal region not noted along left side Ext: right calf tenderness noted without any on the left, radial and distal pulses strong and equal bilaterally, no LE edema noted bilatera Neuro: AOx4, CN2-12 grossly intact, sensation grossly intact with the exception of feeling sharper on the right side, no pronator drift noted, normal cerebellar testing  (finger to nose and heel to shin testing), 5/5 strength UE and LE bilaterally with 4/5 right strip stregnth Psych: tearful at times   A/P: -Discussed with nurse to please contact primary team regarding any changes, even if minor  -Instructed nurse to notify neurology -Overall reassuring neurologic exam with slight weakness on right side, will continue to monitor. Video EEG still in place. Neurology following, appreciate ongoing involvement and will follow their recommendations -Right inguinal pain possibly due to MSK strain during consecutive seizure episodes although concern for DVT so right DVT US ordered. Low concern for kidney stones given non-radiating pain and inconsistent with symptoms.  -Remainder of plan as previously in place   [place], [place], DO 07/17/2020, 1:18 PM PGY-1, [facility] Family Medicine Service pager [phone]

## 2020-07-17 NOTE — Consult Note (Signed)
NEUROLOGY CONSULTATION NOTE   Date of service: July 17, 2020 Patient Name: Bonnie Stone MRN:  [id number] DOB:  08/23/1975 Reason for consult: seizure _ _ _   _ __   _ __ _ _  __ __   _ __   __ _  History of Present Illness   This is a 45 year old woman with a history of polysubstance abuse (cocaine, THC) with no longstanding past medical history who presents with repeat seizure at home on day of discharge for last week's admission for new onset seizure.  She was admitted from April 10 through April 13 for new onset seizures and underwent extensive work-up including CT head, brain MRI and lumbar puncture.  She reported an unclear allergy to contrast (not clear if this was to gadolinium or iodinated contrast) therefore the RI was done without contrast.  That noncontrasted scan showed multifocal hyperintense T2-weighted signal within the brainstem which may be a sequelae of chronic small vessel ischemic disease particularly in the setting of cocaine abuse.  LP results were unremarkable.  Routine EEG during last hospitalization showed diffuse slowing with no epileptiform abnormalities although no events were captured.  She was discharged on Keppra 500 mg twice daily.  Today patient was home for several hours and then experienced a another episode of seizure-like activity with same semiology is prior described as smelling ammonia before hand and then having tonic-clonic activity.  In ED patient complained of severe right-sided headache along with right arm and leg pain which resolved by the time I had seen her.  She was observed to have a small bag fall out of her bra during triage which the nurse described as appearing to cane pain drugs however this was not confirmed.  Has been scheduled the back, ran on the room, and return to the ED room sometime later. In ED, CT head, CTA head and neck, and CT venogram are all normal and without acute abnormality. UDS (+) cocaine and THC.  Hospital course from d/c  summary earlier today:  "Bonnie Stone presented to the ED via EMS after witnessed seizure-like activity approximately 45 seconds in length. Daughter witnessed and reported to ED providers that Bonnie Stone's eyes rolled back along with movement of her arms and legs. She experienced several minutes of post-ictal state with quick return to baseline without focal deficit. On admission, vital signs were stable. EKG and CT head unremarkable. CBC, CMP largely within normal limits with the following exceptions; glucose 112, albumin 3.2, total protein 5.9, calcium 8.5. MRI brain demonstrated no acute intracranial abnormality with multifocal hyperintense T2-weighted signal within the brainstem, which may be a sequela of chronic small vessel ischemia. Neurology consulted, ordered EEG which showed no epileptiform discharges and generalized slowing. Concern for possible encephalitis, neurology recommended lumbar puncture. Lumbar puncture successfully performed without complications. LP normal, did not denote any viral or bacterial etiology. CSF culture demonstrated no growth for 24 hours. She was started on Keppra. UDS positive only for THC, serum ETOH <10. Patient without seizure activity during her hospital stay. Patient discharged on with close PCP and [facility] follow up."   ROS   10 point review of systems was performed and was negative except as described in HPI.  Past History   History reviewed. No pertinent past medical history. Past Surgical History:  Procedure Laterality Date  . CHOLECYSTECTOMY    . TUBAL LIGATION     Family History  Problem Relation Age of Onset  . Seizures Son  Epilepsy  . Seizures Son        Febrile seizures   Social History   Socioeconomic History  . Marital status: Married    Spouse name: Not on file  . Number of children: Not on file  . Years of education: Not on file  . Highest education level: Not on file  Occupational History  . Not on file   Tobacco Use  . Smoking status: Current Some Day Smoker    Packs/day: 0.50    Years: 23.00    Pack years: 11.50    Types: Cigarettes  . Smokeless tobacco: Never Used  . Tobacco comment: Smoking since age of 45 years old  Substance and Sexual Activity  . Alcohol use: Yes    Comment: Drinks once a month with husband  . Drug use: Not Currently    Types: Cocaine    Comment: Reports last use 2008 after conception of last child  . Sexual activity: Yes    Partners: Male  Other Topics Concern  . Not on file  Social History Narrative  . Not on file   Social Determinants of Health   Financial Resource Strain: Not on file  Food Insecurity: Not on file  Transportation Needs: Not on file  Physical Activity: Not on file  Stress: Not on file  Social Connections: Not on file   Allergies  Allergen Reactions  . Bee Venom Anaphylaxis  . Penicillins     Medications   Medications Prior to Admission  Medication Sig Dispense Refill Last Dose  . levETIRAcetam (KEPPRA) 500 MG tablet Take 1 tablet (500 mg total) by mouth 2 (two) times daily. 60 tablet 0 07/16/2020 at Unknown time  . EPINEPHrine 0.3 mg/0.3 mL IJ SOAJ injection Inject 0.3 mLs (0.3 mg total) into the muscle as needed for anaphylaxis. 1 each 1 UNK     Vitals   Vitals:   07/17/20 0630 07/17/20 0631 07/17/20 0632 07/17/20 0645  BP: 113/86   135/84  Pulse: (!) 59 70  73  Resp: 17 16  18   Temp:   98.3 F (36.8 C) 98.1 F (36.7 C)  TempSrc:   Oral Oral  SpO2: 97% 97%  96%     There is no height or weight on file to calculate BMI.  Physical Exam   Physical Exam Gen: A&O x4, NAD HEENT: Atraumatic, normocephalic;mucous membranes moist; oropharynx clear, tongue without atrophy or fasciculations. Neck: Supple, trachea midline. Resp: CTAB, no w/r/r CV: RRR, no m/g/r; nml S1 and S2. 2+ symmetric peripheral pulses. Abd: soft/NT/ND; nabs x 4 quad Extrem: Nml bulk; no cyanosis, clubbing, or edema.  Neuro: *MS: lethargic  but arousable to voice, oriented x4 but does not provide detailed history. *Speech: fluid, nondysarthric. Able to name and repeat. *CN:    I: Deferred   II,III: PERRLA, VFF by confrontation, optic discs sharp   III,IV,VI: EOMI w/o nystagmus, no ptosis   V: Sensation intact from V1 to V3 to LT   VII: Eyelid closure was full.  Smile symmetric.   VIII: Hearing intact to voice   IX,X: Voice normal, palate elevates symmetrically    XI: SCM/trap 5/5 bilat   XII: Tongue protrudes midline, no atrophy or fasciculations   *Motor:   Normal bulk.  No tremor, rigidity or bradykinesia. No pronator drift.    Strength: Dlt Bic Tri WrE WrF FgS Gr HF KnF KnE PlF DoF    Left 5 5 5 5 5 5 5 5 5 5 5  5  Right 5 5 5 5 5 5 5 5 5 5 5 5     *Sensory: Intact to light touch, pinprick, temperature vibration throughout. Symmetric. Propioception intact bilat.  No double-simultaneous extinction.  *Coordination:  Finger-to-nose, heel-to-shin, rapid alternating motions were intact. *Reflexes:  2+ and symmetric throughout without clonus; toes down-going bilat *Gait: deferred  NIHSS = 0  Labs   CBC:  Recent Labs  Lab 07/13/20 1910 07/14/20 0711 07/16/20 0406 07/16/20 2033  WBC 9.6   < > 7.7 9.1  NEUTROABS 5.4  --   --  4.5  HGB 12.7   < > 12.7 14.1  HCT 38.2   < > 38.0 41.3  MCV 100.0   < > 98.2 98.1  PLT 230   < > 207 224   < > = values in this interval not displayed.    Basic Metabolic Panel:  Lab Results  Component Value Date   NA 141 07/16/2020   K 3.7 07/16/2020   CO2 30 07/16/2020   GLUCOSE 92 07/16/2020   BUN 7 07/16/2020   CREATININE 0.80 07/16/2020   CALCIUM 9.6 07/16/2020   GFRNONAA >60 07/16/2020   GFRAA >60 04/20/2017   Lipid Panel: No results found for: LDLCALC HgbA1c:  Lab Results  Component Value Date   HGBA1C 5.5 07/14/2020   Urine Drug Screen:     Component Value Date/Time   LABOPIA NONE DETECTED 07/17/2020 0300   COCAINSCRNUR POSITIVE (A) 07/17/2020 0300   LABBENZ  NONE DETECTED 07/17/2020 0300   AMPHETMU NONE DETECTED 07/17/2020 0300   THCU POSITIVE (A) 07/17/2020 0300   LABBARB NONE DETECTED 07/17/2020 0300    Alcohol Level     Component Value Date/Time   ETH <10 07/17/2020 0330     Impression   Bonnie Stone is a 45 y.o. female presenting with repeat seizure at home on day of discharge for same problem. No PMHx. Query noncompliance with LEV in setting of cocaine abuse.  Recommendations   - Admitted to hospitalist service - STAT routine EEG showed mild diffuse slowing with no epileptiform abnl. Will prolong overnight - Confirm contrast allergy, consider MRI brain with contrast with prep if appropriate - Reloaded with keppra in ED; maint dose increased to 1000mg  bid  ______________________________________________________________________   Thank you for the opportunity to take part in the care of this patient. If you have any further questions, please contact the neurology consultation attending.  Signed,  48, MD [facility] [phone]  If 7pm- 7am, please page neurology on call as listed in AMION.

## 2020-07-17 NOTE — Progress Notes (Signed)
PT Cancellation Note  Patient Details Name: Bonnie Stone MRN: [id number] DOB: 02-09-1976   Cancelled Treatment:    Reason Eval/Treat Not Completed: Patient at procedure or test/unavailable Pt currently on continuous EEG and having leads readjusted. Will follow up as schedule allows.   Cindee Salt, DPT  Acute Rehabilitation Services  Pager: [phone] Office: [phone]    Lehman Prom 07/17/2020, 9:54 AM

## 2020-07-17 NOTE — ED Notes (Signed)
Dr. Eniola at bedside 

## 2020-07-17 NOTE — Progress Notes (Addendum)
FPTS Interim Progress Note  S: Went to bedside to assess patient following seizure-like event.  As described previously, family members have witnessed 4 seizure-like events so far today.  Episodes have lasted anywhere from 7 to 15 seconds, preceded by right-sided posterior headache and sensation of smelling ammonia.  One episode involved shaking in her bilateral fingers, one episode involved shaking in her right arm, and the remaining 2 episodes were not associated with any jerking.  Family reports of disorientation lasting about 1 minute following these events.  Patient reports continued double vision after the events, still with some vision impairment currently.  Patient states that she has had some word finding difficulty, but otherwise is back to her mental baseline.  Patient still reporting groin pain that started today.  Family notes that she recently completed treatment for DVT diagnosed this past December.  O: BP 112/77 (BP Location: Left Arm)   Pulse 70   Temp 98.3 F (36.8 C) (Oral)   Resp 18   LMP 03/16/2017 (Approximate)   SpO2 94%   General: Laying in bed with EEG leads on, NAD Neuro: Alert, conversant Psych: Intermittently tearful  A/P: Seizures Multiple seizure-like events today which appear more focal in nature preceded by right-sided headache and olfactory hallucination associated with postictal period lasting less than 1 minute as well as visual changes/diplopia.  Imaging so far unremarkable.  EEG ongoing, button was pressed.  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  THC and cocaine likely contributing, also considering PNES or conversion disorder. - EEG ongoing - f/u neurology recommendations  Groin pain Family reported history of DVT this past December and recently completed treatment.  DVT study already ordered, to be completed.  However, on chart review, it appears patient was seen by Dr. Wyn Quaker with [facility] for right lower extremity pain and  had a DVT study and ABI in December, both of which were negative.  No prior history of DVT was noted during that visit. - f/u DVT study  Littie Deeds, MD 07/17/2020, 2:31 PM PGY-1, [facility] Family Medicine Service pager [phone]

## 2020-07-17 NOTE — ED Notes (Signed)
Pt spouse [name], would like an update when there is one available. His number is in the chart.

## 2020-07-17 NOTE — Progress Notes (Signed)
LTM maint complete - also spoke w [facility] and they are having difficulties monitoring EEG/cannot get it pulled up.  Will need to escalate to IT however unable to at this time due to staffing,.

## 2020-07-17 NOTE — Progress Notes (Signed)
RN observed pt event at 1320 for about 30 seconds, then A&O x 4, acute "stabbing" headache, button pressed by RN, MDs notfiied.

## 2020-07-17 NOTE — Progress Notes (Signed)
Subjective: No acute events overnight.  Patient states she was taking Keppra.    ROS: negative except above  Examination  Vital signs in last 24 hours: Temp:  [98 F (36.7 C)-98.9 F (37.2 C)] 98.1 F (36.7 C) (04/14 0645) Pulse Rate:  [59-73] 73 (04/14 0645) Resp:  [14-21] 18 (04/14 0645) BP: (97-148)/(72-102) 135/84 (04/14 0645) SpO2:  [96 %-100 %] 96 % (04/14 0645)  General: lying in bed, not in apparent distress CVS: pulse-normal rate and rhythm RS: breathing comfortably Extremities: normal  Neuro: Alert, oriented to place and person but not to time, follows all simple commands, cranial nerves 2-12 grossly intact, spontaneously moving upper extremities in bed.  Basic Metabolic Panel: Recent Labs  Lab 07/13/20 1910 07/13/20 2119 07/14/20 0711 07/15/20 0401 07/16/20 0406 07/16/20 2033 07/17/20 0457  NA 143  --  144 141 142 141  --   K 3.8  --  3.7 4.0 4.1 3.7  --   CL 112*  --  109 108 109 104  --   CO2 24  --  29 28 28 30   --   GLUCOSE 84  --  90 90 78 92  --   BUN 8  --  9 12 12 7   --   CREATININE 0.62  --  0.69 0.78 0.62 0.80  --   CALCIUM 8.5*  --  8.7* 9.0 9.0 9.6  --   MG 2.0  --   --   --   --   --  1.9  PHOS  --  4.5  --   --   --   --   --     CBC: Recent Labs  Lab 07/13/20 1910 07/14/20 0711 07/15/20 0401 07/16/20 0406 07/16/20 2033  WBC 9.6 7.3 7.4 7.7 9.1  NEUTROABS 5.4  --   --   --  4.5  HGB 12.7 12.7 12.6 12.7 14.1  HCT 38.2 37.8 37.2 38.0 41.3  MCV 100.0 99.0 98.9 98.2 98.1  PLT 230 218 209 207 224     Coagulation Studies: No results for input(s): LABPROT, INR in the last 72 hours.  Imaging CT head without contrast 07/13/2020: No acute intracranial abnormality  MRI brain without contrast 07/14/2020: No acute intracranial abnormality  CTA head and neck 07/16/2020: Normal CTA head and neck.  No evidence of large vessel occlusion, hemodynamically significant stenosis, aneurysm.  CTV head 07/16/2020: Normal without evidence of dural  sinus thrombosis.  ASSESSMENT AND PLAN: 45 year old female with recurrent seizures in setting of cocaine use, THC use and medication noncompliance  Recurrent seizures Cocaine use disorder THC use disorder -Overnight EEG did not show any ictal-interictal abnormality -Differentials include provoked seizures in the setting of cocaine use versus new onset epilepsy versus nonepileptic events  Recommendations -Patient describes focal onset of seizures and has had multiple episodes.  Therefore, likely at higher risk of seizure recurrence.  Recommend continuing Keppra 1000 mg twice daily -We will continue LTM EEG for another 24 hours to look for any ictal-interictal abnormalities -If patient remains seizure-free, will discontinue LTM EEG tomorrow. -If patient returns to ED, recommend checking Keppra level and will likely need to be on prolonged EEG without Keppra for characterization of spells -Continue seizure precautions -Substance abuse counseling for cocaine use disorder and THC use disorder -As needed IV Ativan 2 mg for clinical seizure-like activity lasting more than 5 minutes  I have spent a total of  35  minutes with the patient reviewing hospital notes,  test results,  labs and examining the patient as well as establishing an assessment and plan that was discussed personally with the patient.  > 50% of time was spent in direct patient care.    Lindie Spruce Epilepsy [facility] For questions after 5pm please refer to AMION to reach the Neurologist on call

## 2020-07-17 NOTE — Progress Notes (Addendum)
Pt just arriving to the unit at 0645. EEG connected and positioned. AOx4, vs taken, and initial assessment documented. Pt's personal belongings placed away from pt until oncoming staff can thoroughly check belongings. Pt states an understanding.

## 2020-07-17 NOTE — Progress Notes (Signed)
Pt observed unresponsive for several seconds, returning to A&O x 4 shortly thereafter, complaining of being hot, severe headache that "moved around" in her head, crying, dry heaving. RN gave Zofran PRN per MAR, ice pack and fan, notified charge nurse. Family in room stated she had had several similar episodes this morning, charge nurse Arlys John instructed family to press red button when they occur. Additionally, pt complained of severe pain in R leg. Family stated pt has Hx of blood clots that had been treated and leg pain restarted on Sunday, but pt has not sought further tx. Family Service resident paged, informed. Will continue to monitor.

## 2020-07-17 NOTE — ED Notes (Signed)
While this RN was drawing labs pt woke up and stated that she wasn't feeling right and feeling nauseated. Pt reports that she has no pain just feels nauseated. This RN brought pt PRN zofran and ginger ale.

## 2020-07-17 NOTE — Procedures (Signed)
Routine EEG Report  Bonnie Stone is a 45 y.o. female with a history of new onset seizure who is undergoing an EEG to evaluate for seizures.  Report: This EEG was acquired with electrodes placed according to the International 10-20 electrode system (including Fp1, Fp2, F3, F4, C3, C4, P3, P4, O1, O2, T3, T4, T5, T6, A1, A2, Fz, Cz, Pz). The following electrodes were missing or displaced: none.  The occipital dominant rhythm was 4-8 Hz. This activity is reactive to stimulation. Drowsiness was manifested by background fragmentation; sleep was manifested by K complexes and sleep spindles. There was no focal slowing. There were no interictal epileptiform discharges. There were no electrographic seizures identified. There was no abnormal response to photic stimulation or hyperventilation.   Impression and clinical correlation: This EEG was obtained while awake and drowsy and is abnormal 2/2 mild diffuse slowing indicative of global cerebral dysfunction.   Bing Neighbors, MD [facility] [phone]  If 7pm- 7am, please page neurology on call as listed in AMION.

## 2020-07-18 ENCOUNTER — Observation Stay (HOSPITAL_BASED_OUTPATIENT_CLINIC_OR_DEPARTMENT_OTHER): Payer: Medicaid Other

## 2020-07-18 DIAGNOSIS — Z86718 Personal history of other venous thrombosis and embolism: Secondary | ICD-10-CM

## 2020-07-18 DIAGNOSIS — R569 Unspecified convulsions: Secondary | ICD-10-CM | POA: Diagnosis not present

## 2020-07-18 LAB — COMPREHENSIVE METABOLIC PANEL
ALT: 13 U/L (ref 0–44)
AST: 14 U/L — ABNORMAL LOW (ref 15–41)
Albumin: 3.4 g/dL — ABNORMAL LOW (ref 3.5–5.0)
Alkaline Phosphatase: 51 U/L (ref 38–126)
Anion gap: 8 (ref 5–15)
BUN: 12 mg/dL (ref 6–20)
CO2: 29 mmol/L (ref 22–32)
Calcium: 9.4 mg/dL (ref 8.9–10.3)
Chloride: 105 mmol/L (ref 98–111)
Creatinine, Ser: 0.73 mg/dL (ref 0.44–1.00)
GFR, Estimated: >60 mL/min (ref >60)
Glucose, Bld: 90 mg/dL (ref 70–99)
Potassium: 3.8 mmol/L (ref 3.5–5.1)
Sodium: 142 mmol/L (ref 135–145)
Total Bilirubin: 0.4 mg/dL (ref 0.3–1.2)
Total Protein: 6 g/dL — ABNORMAL LOW (ref 6.5–8.1)

## 2020-07-18 LAB — CBC
HCT: 37.7 % (ref 36.0–46.0)
Hemoglobin: 13 g/dL (ref 12.0–15.0)
MCH: 33.2 pg (ref 26.0–34.0)
MCHC: 34.5 g/dL (ref 30.0–36.0)
MCV: 96.4 fL (ref 80.0–100.0)
Platelets: 221 10*3/uL (ref 150–400)
RBC: 3.91 MIL/uL (ref 3.87–5.11)
RDW: 12.6 % (ref 11.5–15.5)
WBC: 6.5 10*3/uL (ref 4.0–10.5)
nRBC: 0 % (ref 0.0–0.2)

## 2020-07-18 MED ORDER — DIVALPROEX SODIUM 500 MG PO DR TAB: 500.0000 mg | DELAYED_RELEASE_TABLET | Freq: Every day | ORAL | 0 refills | Status: DC

## 2020-07-18 MED ORDER — DIVALPROEX SODIUM 250 MG PO DR TAB
500.0000 mg | DELAYED_RELEASE_TABLET | Freq: Every day | ORAL | Status: DC
  Administered 2020-07-18: 500 mg via ORAL
  Filled 2020-07-18: qty 2

## 2020-07-18 NOTE — Discharge Summary (Signed)
Family Medicine Teaching [facility] Discharge Summary  Patient name: Bonnie Stone Medical record number: [id number] Date of birth: June 15, 1975 Age: 45 y.o. Gender: female Date of Admission: 07/16/2020  Date of Discharge: 07/18/2020 Admitting Physician: Fayette Pho, MD  Primary Care Provider: Ailene Ravel, MD Consultants: Neurology   Indication for Hospitalization: seizure-like activity   Discharge Diagnoses/Problem List:  Seizure GERD Substance abuse  Right calf tenderness  Disposition: home   Discharge Condition: medically stable   Discharge Exam:  Temp:  [97.8 F (36.6 C)-98.4 F (36.9 C)] 98.1 F (36.7 C) (04/15 0341) Pulse Rate:  [70-78] 71 (04/15 0341) Resp:  [16-18] 16 (04/15 0341) BP: (98-135)/(56-93) 98/56 (04/15 0341) SpO2:  [94 %-98 %] 98 % (04/15 0341) Physical Exam: General: Patient sitting upright in bed, in no acute distress. Cardiovascular: RRR, no murmurs or gallops auscultated  Respiratory: CTAB, no rales or rhonchi noted Abdomen: soft, notender, BS+ MSK: non-tender on palpation of inguinal region bilaterally Extremities: radial and distal pulses strong and equal bilaterally, no calf tenderness noted bilaterally, no LE edema noted bilaterally Neuro: AOx4, gross sensation intact, CN 2-12 grossly intact, 5/5 UE and LE strength bilaterally, normal finger to nose and heel to shin testing Psych: mood appropriate, very pleasant   Brief Hospital Course:  Bonnie Stone is a 45 y.o. female presenting with repeat seizure at home on day of discharge for same problem. No PMHx.    Witnessed seizure-like activity Presented with witnessed seizure-like activity after being discharged less than a day for possible seizure activity. CT head, CT venogram and CTA head and neck all unremarkable. UDS positive for THC on last admission as well but new finding includes now positive for cocaine use. Ethanol <10. B12 and folate wnl. Neurology consulted. Routine EEG  performed demonstrated mild diffuse slowing with no epileptiform abnormality. Continuous EEG thus far suggestive of mild to moderate diffuse encephalopathy of nonspecific etiology, excessive beta activity likely secondary to benzos, no epileptiform discharge or seizures noted thus far although noted to have about 4 episodes of possible seizure-like activity. Neurological exam normal without focal findings. Patient discharged on Keppra 500 mg bid since last admission, neurology increased to 1000 mg bid daily. Prior to discharge, Keppra discontinued and started on depakote 500 mg daily which patient discharged on. Instructed to follow up with neurology outpatient in 1-2 months and PCP follow up in 1-2 weeks. Instructed to also not drive for 6 months.   Right calf tenderness Patient complained of right inguinal pain and right calf tenderness following possible witnessed seizure while in the hospital. On exam, noted to have bilateral lymph nodes along the inguinal region that only elicted tenderness on palpation of the right side along with right calf. Concern for DVT, ultrasound performed was negative for DVT.   All other issues chronic and stable.     Issues for Hosptial Follow up:  1. Recommend patient have outpatient follow up with psychiatry as outpatient due to concern for underlying mood disorder as well as illicit drug use.  2. F/u with PCP for bilateral iguinal LAD that improved during hospitalization. 3. Elevated diastolic blood pressure, recheck.  4. Ensure patient follows up with outpatient neurology in 1-2 months following discharge. 5. Continue to encourage cessation of substance use. 6. Ensure compliance with depakote per neurology recommendations.    Significant Procedures:  EEG performed, results noted above.   Significant Labs and Imaging:  Recent Labs  Lab 07/16/20 0406 07/16/20 2033 07/18/20 0230  WBC 7.7 9.1 6.5  HGB 12.7 14.1 13.0  HCT 38.0 41.3 37.7  PLT 207 224 221    Recent Labs  Lab 07/13/20 1910 07/13/20 2119 07/14/20 0711 07/15/20 0401 07/16/20 0406 07/16/20 2033 07/17/20 0457 07/18/20 0230  NA 143  --  144 141 142 141  --  142  K 3.8  --  3.7 4.0 4.1 3.7  --  3.8  CL 112*  --  109 108 109 104  --  105  CO2 24  --  29 28 28 30   --  29  GLUCOSE 84  --  90 90 78 92  --  90  BUN 8  --  9 12 12 7   --  12  CREATININE 0.62  --  0.69 0.78 0.62 0.80  --  0.73  CALCIUM 8.5*  --  8.7* 9.0 9.0 9.6  --  9.4  MG 2.0  --   --   --   --   --  1.9  --   PHOS  --  4.5  --   --   --   --   --   --   ALKPHOS 56  --  55  --   --  57  --  51  AST 22  --  15  --   --  21  --  14*  ALT 11  --  13  --   --  15  --  13  ALBUMIN 3.2*  --  3.1*  --   --  3.8  --  3.4*      Results/Tests Pending at Time of Discharge:  Drug screen   Discharge Medications:  Allergies as of 07/18/2020      Reactions   Bee Venom Anaphylaxis   Penicillins       Medication List    STOP taking these medications   levETIRAcetam 500 MG tablet Commonly known as: KEPPRA     TAKE these medications   divalproex 500 MG DR tablet Commonly known as: DEPAKOTE Take 1 tablet (500 mg total) by mouth daily at 12 noon. Start taking on: July 19, 2020   EPINEPHrine 0.3 mg/0.3 mL Soaj injection Commonly known as: EPI-PEN Inject 0.3 mLs (0.3 mg total) into the muscle as needed for anaphylaxis.       Discharge Instructions: Please refer to Patient Instructions section of EMR for full details.  Patient was counseled important signs and symptoms that should prompt return to medical care, changes in medications, dietary instructions, activity restrictions, and follow up appointments.   Follow-Up Appointments:  Follow-up Information    [facility]. Schedule an appointment as soon as possible for a visit.   Specialty: Neurology Why: Please make an appointment as soon as you get discharged for an appointment in 1 month. Contact information: [address] [place] [address] [place] [phone]       [place], [id number], MD. Schedule an appointment as soon as possible for a visit.   Specialty: Family Medicine Why: Please follow up with your PCP in 1-2 weeks for hospital follow up.  Contact information: [address] [place] [address] [place] [phone]               [id number], DO 07/18/2020, 4:13 PM PGY-1, [facility] [place] [facility]

## 2020-07-18 NOTE — Progress Notes (Signed)
vLTM EEG complete. No skin breakdown 

## 2020-07-18 NOTE — Discharge Instructions (Signed)
You were hospitalized at [facility] due to a seizure.  We expect this is from substance use which improved after medication  We are so glad you are feeling better.  Be sure to follow-up with your regularly scheduled appointments.  Please also be sure to follow-up with your PCP at your earliest convenience. Please schedule an appointment with outpatient neurology to be seen in a month. Do not drive for the next 6 months. Thank you for allowing [place] to be a part of your medical care.  Take care, [facility] family medicine team

## 2020-07-18 NOTE — Progress Notes (Signed)
Family Medicine Teaching Service Daily Progress Note Intern Pager: [phone]  Patient name: Bonnie Stone Medical record number: [id number] Date of birth: 1975-07-08 Age: 45 y.o. Gender: female  Primary Care Provider: Ailene Ravel, MD Consultants: Neurology  Code Status: Full  Pt Overview and Major Events to Date:  4/14: Admitted   Assessment and Plan: Bonnie A Hilliardis a 45 y.o.femalepresenting with repeat seizure at homeon day ofdischarge for same problem. No PMHx.   Witnessed seizure-like activity No seizure activity noted overnight. CT head, CT venogram and CTA head and neck all unremarkable. UDS positive for THC on last admission as well but new finding includes now positive for cocaine use. Ethanol <10. B12 and folate wnl. Routine EEG performed demonstrated mild diffuse slowing with no epileptiform abnormality. Denies headache and other associated symptoms this morning. Continuous EEKG thus far suggestive of mild to moderate diffuse encephalopathy of nonspecific etiology, excessive beta activity likely secondary to benzos, no epileptiform discharge or seizures noted thus far. Neurological exam normal without focal findings.  -Neurology following, appreciate continued involvement and recs  -Keppra 1000 mg bid daily  -ongoing continuous EEG per neuro -Vitals per unitroutine -Tylenol 650 mg every 6 as needed -Ibuprofen 600 mg every 6 as needed -Zofran 4 mg every 6 as needed -awaiting RPR -pending serum drugs of abuse panel  Right calf tenderness Right-sided calf tenderness present on exam. Patient complaining of right inguinal region pain yesterday which has essentially resolved. DVT ultrasound ordered yesterday still not completed. Patient denies any tenderness this morning or any associated pain. Denies chest pain and dyspnea. Likely pain secondary to muscle strain follow possible seizure episode.  -f/u right DVT US  GERD Endorsed brief abdominal pain after deep  palpation which resolved prior to my departure.  -Protonix 20 mg daily  Substance abuse Current THC use. UDS in ED was positive for marijuana and cocaine. TOC previously consulted on last admission and spoke with patient. -Nicotine patch as desired  -Follow-upblood drug screen   FEN/GI: regular  PPx: lovenox    Status is: Observation   Dispo: The patient is from: Home              Anticipated d/c is to: Home              Patient currently is not medically stable to d/c.   Difficult to place patient No        Subjective:  No overnight events reported, no seizures overnight. Patient states this morning that she has never felt better. Denies headache, nausea, vomiting and any generalized or localized pain.   Objective: Temp:  [97.8 F (36.6 C)-98.4 F (36.9 C)] 98.1 F (36.7 C) (04/15 0341) Pulse Rate:  [70-78] 71 (04/15 0341) Resp:  [16-18] 16 (04/15 0341) BP: (98-135)/(56-93) 98/56 (04/15 0341) SpO2:  [94 %-98 %] 98 % (04/15 0341) Physical Exam: General: Patient sitting upright in bed, in no acute distress. Cardiovascular: RRR, no murmurs or gallops auscultated  Respiratory: CTAB, no rales or rhonchi noted Abdomen: soft, notender, BS+ MSK: non-tender on palpation of inguinal region bilaterally Extremities: radial and distal pulses strong and equal bilaterally, no calf tenderness noted bilaterally, no LE edema noted bilaterally Neuro: AOx4, gross sensation intact, CN 2-12 grossly intact, 5/5 UE and LE strength bilaterally, normal finger to nose and heel to shin testing Psych: mood appropriate, very pleasant   Nurse present in the room during examination.  Laboratory: Recent Labs  Lab 07/16/20 0406 07/16/20 2033 07/18/20 0230  WBC 7.7  9.1 6.5  HGB 12.7 14.1 13.0  HCT 38.0 41.3 37.7  PLT 207 224 221   Recent Labs  Lab 07/14/20 0711 07/15/20 0401 07/16/20 0406 07/16/20 2033 07/18/20 0230  NA 144   < > 142 141 142  K 3.7   < > 4.1 3.7 3.8  CL 109   <  > 109 104 105  CO2 29   < > 28 30 29   BUN 9   < > 12 7 12   CREATININE 0.69   < > 0.62 0.80 0.73  CALCIUM 8.7*   < > 9.0 9.6 9.4  PROT 5.9*  --   --  7.0 6.0*  BILITOT 0.4  --   --  0.5 0.4  ALKPHOS 55  --   --  57 51  ALT 13  --   --  15 13  AST 15  --   --  21 14*  GLUCOSE 90   < > 78 92 90   < > = values in this interval not displayed.      Imaging/Diagnostic Tests: EEG adult  Result Date: 07/17/2020 , MD     07/17/2020  8:04 AM Routine EEG Report Bonnie Stone is a 45 y.o. female with a history of new onset seizure who is undergoing an EEG to evaluate for seizures. Report: This EEG was acquired with electrodes placed according to the International 10-20 electrode system (including Fp1, Fp2, F3, F4, C3, C4, P3, P4, O1, O2, T3, T4, T5, T6, A1, A2, Fz, Cz, Pz). The following electrodes were missing or displaced: none. The occipital dominant rhythm was 4-8 Hz. This activity is reactive to stimulation. Drowsiness was manifested by background fragmentation; sleep was manifested by K complexes and sleep spindles. There was no focal slowing. There were no interictal epileptiform discharges. There were no electrographic seizures identified. There was no abnormal response to photic stimulation or hyperventilation. Impression and clinical correlation: This EEG was obtained while awake and drowsy and is abnormal 2/2 mild diffuse slowing indicative of global cerebral dysfunction. Christian Mate, MD [facility] [phone] If 7pm- 7am, please page neurology on call as listed in AMION.   Overnight EEG with video  Result Date: 07/17/2020 [phone], MD     07/17/2020  9:49 AM Patient Name: Bonnie Stone MRN: 07/19/2020 Epilepsy Attending: Christian Mate Referring Physician/Provider: Dr [id number] Duration: 07/16/2020 2332 to 07/17/2020 0945 Patient history:  45 y.o. female with a history of new onset seizure who is undergoing an EEG to evaluate for seizures. Level of  alertness: Awake, asleep AEDs during EEG study: LEV Technical aspects: This EEG study was done with scalp electrodes positioned according to the 10-20 International system of electrode placement. Electrical activity was acquired at a sampling rate of 500Hz  and reviewed with a high frequency filter of 70Hz  and a low frequency filter of 1Hz . EEG data were recorded continuously and digitally stored. Description: During awake state, no clear posterior dominant rhythm was seen.  Sleep was characterized by vertex waves, sleep spindles (12 to 14 Hz), maximal frontocentral region.  EEG showed continuous generalized 3 to 6 Hz theta, delta slowing.  There is also an excessive amount of 15 to 18 Hz,  beta activity distributed symmetrically and diffusely.  Hyperventilation and photic stimulation were not performed.   ABNORMALITY - Continuous slow, generalized - Excessive beta, generalized IMPRESSION: This study is suggestive of mild to moderate diffuse encephalopathy, nonspecific etiology. The excessive beta activity seen in  the background is most likely due to the effect of medications like benzodiazepine and is a benign EEG pattern. No seizures or epileptiform discharges were seen throughout the recording. Bosie Clos, Roxanna Mew, DO 07/18/2020, 6:40 AM PGY-1, [place] [facility] FPTS Intern pager: [phone], text pages welcome

## 2020-07-18 NOTE — TOC Transition Note (Signed)
Transition of Care [facility]) - CM/SW Discharge Note   Patient Details  Name: Bonnie Stone MRN: [id number] Date of Birth: July 11, 1975  Transition of Care [facility]) CM/SW Contact:  Kermit Balo, RN Phone Number: 07/18/2020, 2:58 PM   Clinical Narrative:    Pt is discharging home with self care. No needs per TOC.   Final next level of care: Home/Self Care Barriers to Discharge: No Barriers Identified   Patient Goals and CMS Choice        Discharge Placement                       Discharge Plan and Services                                     Social Determinants of Health (SDOH) Interventions     Readmission Risk Interventions No flowsheet data found.

## 2020-07-18 NOTE — Plan of Care (Signed)
  Problem: Health Behavior/Discharge Planning: Goal: Ability to manage health-related needs will improve Outcome: Adequate for Discharge   Problem: Clinical Measurements: Goal: Will remain free from infection Outcome: Adequate for Discharge   Problem: Coping: Goal: Level of anxiety will decrease Outcome: Adequate for Discharge   Problem: Elimination: Goal: Will not experience complications related to bowel motility Outcome: Adequate for Discharge Goal: Will not experience complications related to urinary retention Outcome: Adequate for Discharge   Problem: Pain Managment: Goal: General experience of comfort will improve Outcome: Adequate for Discharge   Problem: Safety: Goal: Ability to remain free from injury will improve Outcome: Adequate for Discharge   Problem: Skin Integrity: Goal: Risk for impaired skin integrity will decrease Outcome: Adequate for Discharge   Problem: Education: Goal: Expressions of having a comfortable level of knowledge regarding the disease process will increase Outcome: Adequate for Discharge   Problem: Coping: Goal: Ability to adjust to condition or change in health will improve Outcome: Adequate for Discharge Goal: Ability to identify appropriate support needs will improve Outcome: Adequate for Discharge   Problem: Health Behavior/Discharge Planning: Goal: Compliance with prescribed medication regimen will improve Outcome: Adequate for Discharge   Problem: Medication: Goal: Risk for medication side effects will decrease Outcome: Adequate for Discharge   Problem: Clinical Measurements: Goal: Complications related to the disease process, condition or treatment will be avoided or minimized Outcome: Adequate for Discharge Goal: Diagnostic test results will improve Outcome: Adequate for Discharge   Problem: Safety: Goal: Verbalization of understanding the information provided will improve Outcome: Adequate for Discharge   Problem:  Self-Concept: Goal: Level of anxiety will decrease Outcome: Adequate for Discharge Goal: Ability to verbalize feelings about condition will improve Outcome: Adequate for Discharge

## 2020-07-18 NOTE — Evaluation (Signed)
Physical Therapy Evaluation & Discharge Patient Details Name: Bonnie Stone MRN: [id number] DOB: [birth year] Today's Date: 07/18/2020   History of Present Illness  45 y/o female presented to ED on 4/13 with repeat seizures at home on day of discharge for same problem. Recently admitted 4/10-4/13 for new onset seizures, underwent extensive work up including head CT, brain MRI, lumbar puncture. Had improved clinically and was discharged with neurology follow up. PMH: polysubstance abuse, DVT  Clinical Impression  PTA, patient lives with husband, children, and grandchild and reports independence with mobility. Patient overall independent with OOB mobility with no balance deficits noted. Distance limited by continuous EEG. No further skilled PT needs in acute setting. No PT follow up recommended at this time.      Follow Up Recommendations No PT follow up    Equipment Recommendations  None recommended by PT    Recommendations for Other Services       Precautions / Restrictions Precautions Precautions: None Precaution Comments: seizure Restrictions Weight Bearing Restrictions: No      Mobility  Bed Mobility Overal bed mobility: Independent                  Transfers Overall transfer level: Independent Equipment used: None                Ambulation/Gait Ambulation/Gait assistance: Independent Gait Distance (Feet): 50 Feet Assistive device: None Gait Pattern/deviations: WFL(Within Functional Limits) Gait velocity: normal Gait velocity interpretation: >4.37 ft/sec, indicative of normal walking speed General Gait Details: distance limited by continuous EEG  Stairs            Wheelchair Mobility    Modified Rankin (Stroke Patients Only)       Balance Overall balance assessment: Independent                                           Pertinent Vitals/Pain Pain Assessment: No/denies pain    Home Living Family/patient expects to  be discharged to:: Private residence Living Arrangements: Spouse/significant other;Children;Other relatives (granddaughter is 1.5 y/o) Available Help at Discharge: Family;Available 24 hours/day Type of Home: Mobile home Home Access: Ramped entrance     Home Layout: One level Home Equipment: Bonnie Stone - 2 wheels;Bonnie Stone - 4 wheels;Bedside commode;Tub bench;Grab bars - tub/shower      Prior Function Level of Independence: Independent         Comments: babysits grandchildren     Hand Dominance        Extremity/Trunk Assessment   Upper Extremity Assessment Upper Extremity Assessment: Overall WFL for tasks assessed    Lower Extremity Assessment Lower Extremity Assessment: Overall WFL for tasks assessed    Cervical / Trunk Assessment Cervical / Trunk Assessment: Normal  Communication   Communication: No difficulties  Cognition Arousal/Alertness: Awake/alert Behavior During Therapy: WFL for tasks assessed/performed Overall Cognitive Status: Within Functional Limits for tasks assessed                                        General Comments      Exercises     Assessment/Plan    PT Assessment Patent does not need any further PT services  PT Problem List         PT Treatment Interventions      PT Goals (Current  goals can be found in the Care Plan section)  Acute Rehab PT Goals Patient Stated Goal: to go home PT Goal Formulation: All assessment and education complete, DC therapy    Frequency     Barriers to discharge        Co-evaluation               AM-PAC PT "6 Clicks" Mobility  Outcome Measure Help needed turning from your back to your side while in a flat bed without using bedrails?: None Help needed moving from lying on your back to sitting on the side of a flat bed without using bedrails?: None Help needed moving to and from a bed to a chair (including a wheelchair)?: None Help needed standing up from a chair using your arms  (e.g., wheelchair or bedside chair)?: None Help needed to walk in hospital room?: None Help needed climbing 3-5 steps with a railing? : None 6 Click Score: 24    End of Session   Activity Tolerance: Patient tolerated treatment well Patient left: in bed;with call bell/phone within reach;with bed alarm set Nurse Communication: Mobility status PT Visit Diagnosis: Muscle weakness (generalized) (M62.81)    Time: 0347-4259 PT Time Calculation (min) (ACUTE ONLY): 26 min   Charges:   PT Evaluation $PT Eval Low Complexity: 1 Low          Bonnie Stone A. Dan Humphreys PT, DPT Acute Rehabilitation Services Pager [phone] Office [phone]   Bonnie Stone 07/18/2020, 9:57 AM

## 2020-07-18 NOTE — Progress Notes (Signed)
Bilateral lower extremity venous duplex completed. Refer to "CV Proc" under chart review to view preliminary results.  07/18/2020 1:57 PM Eula Fried., MHA, RVT, RDCS, RDMS

## 2020-07-18 NOTE — Progress Notes (Signed)
Subjective: No acute events overnight.  No further seizures.  Patient's husband and son at bedside.  ROS: negative except above  Examination  Vital signs in last 24 hours: Temp:  [97.8 F (36.6 C)-98.4 F (36.9 C)] 97.8 F (36.6 C) (04/15 0700) Pulse Rate:  [69-78] 69 (04/15 0700) Resp:  [16-18] 17 (04/15 0700) BP: (98-134)/(56-100) 120/100 (04/15 0700) SpO2:  [94 %-100 %] 100 % (04/15 0700)  General: lying in bed, NAD CVS: pulse-normal rate and rhythm RS: breathing comfortably Extremities: normal, warm Neuro:  AOx3, follows all commands, cranial nerves 2-12 grossly intact, spontaneously moving upper extremities in bed.  Basic Metabolic Panel: Recent Labs  Lab 07/13/20 1910 07/13/20 2119 07/14/20 0711 07/15/20 0401 07/16/20 0406 07/16/20 2033 07/17/20 0457 07/18/20 0230  NA 143  --  144 141 142 141  --  142  K 3.8  --  3.7 4.0 4.1 3.7  --  3.8  CL 112*  --  109 108 109 104  --  105  CO2 24  --  29 28 28 30   --  29  GLUCOSE 84  --  90 90 78 92  --  90  BUN 8  --  9 12 12 7   --  12  CREATININE 0.62  --  0.69 0.78 0.62 0.80  --  0.73  CALCIUM 8.5*  --  8.7* 9.0 9.0 9.6  --  9.4  MG 2.0  --   --   --   --   --  1.9  --   PHOS  --  4.5  --   --   --   --   --   --     CBC: Recent Labs  Lab 07/13/20 1910 07/14/20 0711 07/15/20 0401 07/16/20 0406 07/16/20 2033 07/18/20 0230  WBC 9.6 7.3 7.4 7.7 9.1 6.5  NEUTROABS 5.4  --   --   --  4.5  --   HGB 12.7 12.7 12.6 12.7 14.1 13.0  HCT 38.2 37.8 37.2 38.0 41.3 37.7  MCV 100.0 99.0 98.9 98.2 98.1 96.4  PLT 230 218 209 207 224 221     Coagulation Studies: No results for input(s): LABPROT, INR in the last 72 hours.  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  Recommendations -Discontinue LTM EEG as we have captured the events -With normal LTM EEG, it is unlikely that patient has epilepsy. However, she also reports headache and mood issues.  Also, cocaine and THC use increases urges, seizures in future.  Therefore, we will switch from Keppra to Depakote to help with mood stabilization, headaches. -Discussed seizure provoking factors including seizure deprivation, alcohol use, drug use. -If patient returns to ED, recommend checking Depakote level and UDS -Continue seizure precautions -Substance abuse counseling for cocaine use disorder and THC use disorder -Follow-up with neurology in 4 to 8 weeks  Seizure precautions: Per [facility] statutes, patients with seizures are not allowed to drive until they have been seizure-free for six months and cleared by a physician    Use caution when using heavy equipment or power tools. Avoid working on ladders or at heights. Take showers instead of baths. Ensure the water temperature is not too high on the home water heater. Do not go  swimming alone. Do not lock yourself in a room alone (i.e. bathroom). When caring for infants or small children, sit down when holding, feeding, or changing them to minimize risk of injury to the child in the event you have a seizure. Maintain good sleep hygiene. Avoid alcohol.    If patient has another seizure, call 911 and bring them back to the ED if: A.  The seizure lasts longer than 5 minutes.      B.  The patient doesn't wake shortly after the seizure or has new problems such as difficulty seeing, speaking or moving following the seizure C.  The patient was injured during the seizure D.  The patient has a temperature over 102 F (39C) E.  The patient vomited during the seizure and now is having trouble breathing    During the Seizure   - First, ensure adequate ventilation and place patients on the floor on their left  side  Loosen clothing around the neck and ensure the airway is patent. If the patient is clenching the teeth, do not force the mouth open with any object as this can cause severe damage - Remove all items from the surrounding that can be hazardous. The patient may be oblivious to what's happening and may not even know what he or she is doing. If the patient is confused and wandering, either gently guide him/her away and block access to outside areas - Reassure the individual and be comforting - Call 911. In most cases, the seizure ends before EMS arrives. However, there are cases when seizures may last over 3 to 5 minutes. Or the individual may have developed breathing difficulties or severe injuries. If a pregnant patient or a person with diabetes develops a seizure, it is prudent to call an ambulance. - Finally, if the patient does not regain full consciousness, then call EMS. Most patients will remain confused for about 45 to 90 minutes after a seizure, so you must use judgment in calling for help. - Avoid restraints but make sure the patient is in a bed with padded side rails - Place the individual in a lateral position with the neck slightly flexed; this will help the saliva drain from the mouth and prevent the tongue from falling backward - Remove all nearby furniture and other hazards from the area - Provide verbal assurance as the individual is regaining consciousness - Provide the patient with privacy if possible - Call for help and start treatment as ordered by the caregiver    After the Seizure (Postictal Stage)   After a seizure, most patients experience confusion, fatigue, muscle pain and/or a headache. Thus, one should permit the individual to sleep. For the next few days, reassurance is essential. Being calm and helping reorient the person is also of importance.   Most seizures are painless and end spontaneously. Seizures are not harmful to others but can lead to complications such  as stress on the lungs, brain and the heart. Individuals with prior lung problems may develop labored breathing and respiratory distress.   I have spent a total of 25  minuteswith the patient reviewing hospitalnotes,  test results, labs and examining the patient as well as establishing an assessment and plan that was discussed personally with the patient and family at bedside.>50% of time was spent in direct patient care.  Lindie Spruce Epilepsy [facility] For questions after 5pm please refer to AMION to reach the Neurologist on call

## 2020-07-18 NOTE — Procedures (Addendum)
Patient Name: BELLATRIX DEVONSHIRE  MRN: [id number]  Epilepsy Attending: Charlsie Quest  Referring Physician/Provider: Dr Fayette Pho Duration: 07/17/2020 2332 to 07/18/2020 0934  Patient history: 45 y.o.femalewith a history of new onset seizurewho is undergoing an EEG to evaluate for seizures.   Level of alertness: Awake, asleep  AEDs during EEG study: LEV  Technical aspects: This EEG study was done with scalp electrodes positioned according to the 10-20 International system of electrode placement. Electrical activity was acquired at a sampling rate of 500Hz  and reviewed with a high frequency filter of 70Hz  and a low frequency filter of 1Hz . EEG data were recorded continuously and digitally stored.   Description: During awake state, no clear posterior dominant rhythm was seen. Sleep was characterized by vertex waves, sleep spindles (12 to 14 Hz), maximal frontocentral region. EEG showed continuous generalized 3 to 6 Hz theta, delta slowing. There is also an excessive amount of 15 to 18 Hz  beta activity distributed symmetrically and diffusely. Hyperventilation and photic stimulation were not performed.     Of note, parts of study were difficult to interpret due to significant movement and electrode artifact.   ABNORMALITY - Continuous slow, generalized - Excessive beta, generalized  IMPRESSION: This study is suggestive of mild to moderate diffuse encephalopathy, nonspecific etiology. The excessive beta activity seen in the background is most likely due to the effect of medications like benzodiazepine and is a benign EEG pattern. No seizures or epileptiform discharges were seen throughout the recording.  Taliah Porche 

## 2020-07-18 NOTE — Progress Notes (Signed)
Discharged to home after IV access removed and discharge instructions reviewed with pt and children.  All questions answered.

## 2020-07-29 LAB — THC,MS,WB/SP RFX
Cannabidiol: NEGATIVE ng/mL
Cannabinoid Confirmation: POSITIVE
Cannabinol: NEGATIVE ng/mL
Carboxy-THC: 16.5 ng/mL
Hydroxy-THC: NEGATIVE ng/mL

## 2020-08-04 LAB — DRUG SCREEN 10 W/CONF, SERUM
Amphetamines, IA: NEGATIVE ng/mL
Barbiturates, IA: NEGATIVE ug/mL
Benzodiazepines, IA: NEGATIVE ng/mL
Cocaine & Metabolite, IA: POSITIVE ng/mL — AB
Methadone, IA: NEGATIVE ng/mL
Opiates, IA: NEGATIVE ng/mL
Oxycodones, IA: NEGATIVE ng/mL
Phencyclidine, IA: NEGATIVE ng/mL
Propoxyphene, IA: NEGATIVE ng/mL
THC(Marijuana) Metabolite, IA: POSITIVE ng/mL — AB

## 2020-08-04 LAB — COCAINE,MS,WB/SP RFX
Benzoylecgonine: 128 ng/mL
Cocaine Confirmation: POSITIVE
Cocaine: NEGATIVE ng/mL

## 2020-08-05 ENCOUNTER — Ambulatory Visit: Payer: Medicaid Other | Admitting: Neurology

## 2020-08-05 ENCOUNTER — Telehealth: Payer: Self-pay | Admitting: *Deleted

## 2020-08-05 ENCOUNTER — Encounter: Payer: Self-pay | Admitting: Neurology

## 2020-08-05 NOTE — Telephone Encounter (Signed)
No showed new patient appointment. 

## 2020-08-12 ENCOUNTER — Ambulatory Visit: Payer: Medicaid Other | Admitting: Neurology

## 2020-08-12 ENCOUNTER — Telehealth: Payer: Self-pay | Admitting: *Deleted

## 2020-08-12 ENCOUNTER — Encounter: Payer: Self-pay | Admitting: Neurology

## 2020-08-12 VITALS — BP 122/82 | HR 72 | Ht 64.0 in | Wt 141.5 lb

## 2020-08-12 DIAGNOSIS — G40909 Epilepsy, unspecified, not intractable, without status epilepticus: Secondary | ICD-10-CM | POA: Diagnosis not present

## 2020-08-12 MED ORDER — LAMOTRIGINE ER 200 MG PO TB24: 200.0000 mg | ORAL_TABLET | Freq: Every day | ORAL | 4 refills | Status: DC

## 2020-08-12 MED ORDER — LAMOTRIGINE ER 25 MG PO TB24: ORAL_TABLET | ORAL | 0 refills | Status: DC

## 2020-08-12 NOTE — Telephone Encounter (Signed)
PA for lamotrigine ER 200mg  started on covermymeds (key ). Pt has pharmacy coverage through [facility] [phone]). PA Case: ([id number] approved through 08/12/2021.

## 2020-08-12 NOTE — Progress Notes (Signed)
Chief Complaint  Patient presents with  . New Patient (Initial Visit)    She is here with her husband, Bonnie Stone. She presented to ED on 07/13/20 after having a first-time, witnessed seizure. She was started on Depakote DR 500mg , one tablet daily. No further episodes since discharge.       ASSESSMENT AND PLAN  Bonnie Stone is a 45 y.o. 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  MRI of the brain without contrast July 13 2020:: 1. No acute intracranial abnormality. 2. Multifocal hyperintense T2-weighted signal within the brainstem, which may be a sequela of chronic small vessel ischemia.  CT HEAD on July 16, 2020  Normal head CT.  No acute intracranial abnormality.  CTA HEAD AND NECK IMPRESSION:  Normal CTA of the head and neck. No large vessel occlusion, hemodynamically significant stenosis, or other acute vascular abnormality. No aneurysm.  CT VENOGRAM IMPRESSION:  Normal CT venogram.  No evidence for dural sinus thrombosis.  Laboratory evaluation April 2022, CMP, normal creatinine 0.73, albumin 3.4, total protein 6.0, normal CBC, B12, magnesium, alcohol less than 10, normal TSH, A1c 5.5, negative HIV, normal CPK 67,  negative RPR, cocaine was + July 17, 2020, that was confirmed, serum drug screen and urine drug screen was also positive for marijuana THC, negative hepatitis C,  CSF on July 14, 2020,  negative for HSV type I/II,, varicella-zoster PCR, negative culture, WBC 0, RBC 1, protein 27, glucose 59,   Video-EEG monitoring April 13-14 2022. This study is suggestive of mild to moderate diffuse encephalopathy, nonspecific etiology. The excessive beta activity seen in the background is most likely due to the effect of medications like benzodiazepine and is a benign EEG pattern. No seizures or epileptiform discharges were seen throughout the recording.  Multiple events were captured as described above without concomitant eeg change and were NOT epileptic.    HISTORICAL  Bonnie Stone is a 45 year old female, seen in request by her primary care physician Dr.   49 T for evaluation of seizure, initial evaluation was with her husband on Aug 12, 2020  I reviewed and summarized the referring note.  Past medical history Illicit drug use, UDS was positive for marijuana and cocaine, Smoking Mood disorder, but was never treated,  On July 13, 2020, she was grocery shopping with her 46 year old daughter, on her way to check out, she felt dizzy, was able to manage to the parking lot, then lost consciousness, daughter reported weakness generalized tonic-clonic activity, she woke up with paramedics around her, postevent confusion  She was admitted to the hospital, had extensive evaluations, MRI of the brain showed multifocal small vessel disease, mainly involving brainstem, no acute abnormality, CT angiogram of head and neck, CT venogram of the brain showed no significant abnormality  UDS was positive for marijuana  She was discharged to home on July 16, 2020, husband reported she was noted to be very weak, needs  to be supported back to home, the same day of discharge few hours later, she was noted to have recurrent seizure-like spells, patient was able to prescribe she often has the aura of smelling ammonia before the onset of body jerking movement,  She also had spinal tap July 14, 2020, there was no significant abnormality found, but the second UDS on April 14 was positive for both marijuana and cocaine,   REVIEW OF SYSTEMS:  Full 14 system review of systems performed and notable only for as above All other review of systems were negative.  PHYSICAL EXAM:   Vitals:   08/12/20 0945  BP: 122/82  Pulse: 72  Weight: 141 lb 8 oz (64.2 kg)  Height: 5\' 4"  (1.626 m)   Not recorded     Body mass index is 24.29 kg/m.  PHYSICAL EXAMNIATION:  Gen: NAD, conversant, well nourised, well groomed                     Cardiovascular: Regular rate rhythm, no peripheral edema, warm, nontender. Eyes: Conjunctivae clear without exudates or hemorrhage Neck: Supple, no carotid bruits. Pulmonary: Clear to auscultation bilaterally   NEUROLOGICAL EXAM:  MENTAL STATUS: Speech:    Speech is normal; fluent and spontaneous with normal comprehension.  Cognition:     Orientation to time, place and person     Normal recent and remote memory     Normal Attention span and concentration     Normal Language, naming, repeating,spontaneous speech     Fund of knowledge   CRANIAL NERVES: CN II: Visual fields are full to confrontation. Pupils are round equal and briskly reactive to light. CN III, IV, VI: extraocular movement are normal. No ptosis. CN V: Facial sensation is intact to light touch CN VII: Face is symmetric with normal eye closure  CN VIII: Hearing is normal to causal conversation. CN IX, X: Phonation is normal. CN XI: Head turning and shoulder shrug are intact  MOTOR: There is no pronator drift of out-stretched arms. Muscle bulk and tone are normal. Muscle strength is normal.  REFLEXES: Reflexes are 2+ and symmetric at the biceps, triceps, knees, and ankles. Plantar responses are flexor.  SENSORY: Intact to light touch, pinprick and vibratory sensation are intact in fingers and toes.  COORDINATION: There is no trunk or limb dysmetria  noted.  GAIT/STANCE: Posture is normal. Gait is steady with normal steps, base, arm swing, and turning. Heel and toe walking are normal. Tandem gait is normal.  Romberg is absent.  ALLERGIES: Allergies  Allergen Reactions  . Bee Venom Anaphylaxis  . Penicillins     HOME MEDICATIONS: Current Outpatient Medications  Medication Sig Dispense Refill  . divalproex (DEPAKOTE) 500 MG DR tablet Take 1 tablet (500 mg total) by mouth daily at 12 noon. 30 tablet 0  . EPINEPHrine 0.3 mg/0.3 mL IJ SOAJ injection Inject 0.3 mLs (0.3 mg total) into the muscle as needed for anaphylaxis. 1 each 1   No current facility-administered medications for this visit.    PAST MEDICAL HISTORY: Past Medical History:  Diagnosis Date  . Seizure (HCC)     PAST SURGICAL HISTORY: Past Surgical History:  Procedure Laterality Date  . CHOLECYSTECTOMY    . TUBAL LIGATION      FAMILY HISTORY: Family History  Problem Relation Age of Onset  . Seizures Son        Epilepsy  . Seizures Son        Febrile seizures  . Lung  cancer Mother   . Other Father        unknown    SOCIAL HISTORY: Social History   Socioeconomic History  . Marital status: Married    Spouse name: Not on file  . Number of children: 4  . Years of education: 63  . Highest education level: High school graduate  Occupational History  . Occupation: homemaker  Tobacco Use  . Smoking status: Current Some Day Smoker    Packs/day: 0.50    Years: 23.00    Pack years: 11.50    Types: Cigarettes  . Smokeless tobacco: Never Used  . Tobacco comment: Smoking since age of 45 years old  Substance and Sexual Activity  . Alcohol use: Yes    Comment: Drinks once a month with husband  . Drug use: Not Currently    Types: Cocaine    Comment: Reports last use 2008 after conception of last child  . Sexual activity: Yes    Partners: Male  Other Topics Concern  . Not on file  Social History Narrative   Lives at home with her family.   3-4  cups caffeine per day.   Right-handed.   Social Determinants of Health   Financial Resource Strain: Not on file  Food Insecurity: Not on file  Transportation Needs: Not on file  Physical Activity: Not on file  Stress: Not on file  Social Connections: Not on file  Intimate Partner Violence: Not on file      Levert Feinstein, M.D. Ph.D.  [facility] [address] [place], [place] [address] Ph: [phone] Fax: [phone]  CC:  Doreene Eland, MD [address] [place],  [place] [address]  Hamrick, Durward Fortes, MD

## 2020-08-22 ENCOUNTER — Telehealth: Payer: Self-pay | Admitting: Neurology

## 2020-08-22 NOTE — Telephone Encounter (Signed)
Pt called wanting to ask several questions to the RN regarding her divalproex (DEPAKOTE) 500 MG DR tablet. Please advise.

## 2020-08-25 ENCOUNTER — Telehealth: Payer: Self-pay | Admitting: Neurology

## 2020-08-25 NOTE — Telephone Encounter (Signed)
Please check with patient to see how much lamotrigine is she on, if her lamotrigine daily dose is more than 100 mg, it is okay to not have to overlap with Depakote  Make sure she does not have recurrent seizure

## 2020-08-25 NOTE — Telephone Encounter (Signed)
Joe@ [id number] has called for a refill for pt's divalproex (DEPAKOTE) 500 MG DR tablet. Joe said pt informed him he is being weaned off so she would only need a 3 week supply.

## 2020-08-26 MED ORDER — DIVALPROEX SODIUM 500 MG PO DR TAB: 500.0000 mg | DELAYED_RELEASE_TABLET | Freq: Every day | ORAL | 0 refills | Status: DC

## 2020-08-26 NOTE — Telephone Encounter (Signed)
Called patient and informed her Dr Terrace Arabia sent in RX for depakote, reviewed sig. She stated she only got #30 lamotrigine. I advised she call pharmacy to discuss, may be that they had to order drug. She  verbalized understanding, appreciation.

## 2020-08-26 NOTE — Telephone Encounter (Signed)
Meds ordered this encounter  Medications  . divalproex (DEPAKOTE) 500 MG DR tablet    Sig: Take 1 tablet (500 mg total) by mouth daily at 12 noon.    Dispense:  21 tablet    Refill:  0

## 2020-08-26 NOTE — Telephone Encounter (Signed)
Called patient who stated she started lamotrigine late due to [facility] having to order it. She  just started week 2, so  is taking lamictal 25 mg two times daily. I advised will let Dr Terrace Arabia know that she still needs Depakote taper. Patient verbalized understanding, appreciation.

## 2020-08-26 NOTE — Telephone Encounter (Signed)
See phone note dated 08/25/20.

## 2020-08-26 NOTE — Addendum Note (Signed)
Addended by: Levert Feinstein on: 08/26/2020 03:41 PM   Modules accepted: Orders

## 2020-09-02 ENCOUNTER — Other Ambulatory Visit: Payer: Self-pay | Admitting: Neurology

## 2020-10-02 ENCOUNTER — Other Ambulatory Visit: Payer: Self-pay | Admitting: Neurology

## 2020-10-21 ENCOUNTER — Other Ambulatory Visit: Payer: Self-pay

## 2020-10-21 ENCOUNTER — Encounter: Payer: Self-pay | Admitting: *Deleted

## 2020-10-21 ENCOUNTER — Emergency Department
Admission: EM | Admit: 2020-10-21 | Discharge: 2020-10-21 | Disposition: A | Discharge status: Home / Self Care | Payer: Medicaid Other | Attending: Emergency Medicine | Admitting: Emergency Medicine

## 2020-10-21 DIAGNOSIS — G40909 Epilepsy, unspecified, not intractable, without status epilepticus: Secondary | ICD-10-CM | POA: Insufficient documentation

## 2020-10-21 DIAGNOSIS — R569 Unspecified convulsions: Secondary | ICD-10-CM | POA: Diagnosis present

## 2020-10-21 DIAGNOSIS — F17210 Nicotine dependence, cigarettes, uncomplicated: Secondary | ICD-10-CM | POA: Diagnosis not present

## 2020-10-21 LAB — CBC
HCT: 40.8 % (ref 36.0–46.0)
Hemoglobin: 13.7 g/dL (ref 12.0–15.0)
MCH: 34.7 pg — ABNORMAL HIGH (ref 26.0–34.0)
MCHC: 33.6 g/dL (ref 30.0–36.0)
MCV: 103.3 fL — ABNORMAL HIGH (ref 80.0–100.0)
Platelets: 266 10*3/uL (ref 150–400)
RBC: 3.95 MIL/uL (ref 3.87–5.11)
RDW: 12.5 % (ref 11.5–15.5)
WBC: 14 10*3/uL — ABNORMAL HIGH (ref 4.0–10.5)
nRBC: 0 % (ref 0.0–0.2)

## 2020-10-21 LAB — COMPREHENSIVE METABOLIC PANEL
ALT: 12 U/L (ref 0–44)
AST: 17 U/L (ref 15–41)
Albumin: 3.9 g/dL (ref 3.5–5.0)
Alkaline Phosphatase: 74 U/L (ref 38–126)
Anion gap: 8 (ref 5–15)
BUN: 13 mg/dL (ref 6–20)
CO2: 28 mmol/L (ref 22–32)
Calcium: 9.1 mg/dL (ref 8.9–10.3)
Chloride: 104 mmol/L (ref 98–111)
Creatinine, Ser: 0.87 mg/dL (ref 0.44–1.00)
GFR, Estimated: >60 mL/min (ref >60)
Glucose, Bld: 96 mg/dL (ref 70–99)
Potassium: 4 mmol/L (ref 3.5–5.1)
Sodium: 140 mmol/L (ref 135–145)
Total Bilirubin: 0.5 mg/dL (ref 0.3–1.2)
Total Protein: 7.3 g/dL (ref 6.5–8.1)

## 2020-10-21 NOTE — ED Triage Notes (Signed)
EMS brings pt in from home to lobby via w/c for seizure while in shower PTA; pt sitting up on couch upon their arrival; takes lamictal

## 2020-10-21 NOTE — ED Provider Notes (Signed)
[facility] Emergency Department Provider Note  ____________________________________________   I have reviewed the triage vital signs and the nursing notes.   HISTORY  Chief Complaint Seizures   History limited by: Not Limited   HPI Bonnie Stone is a 45 y.o. female who presents to the emergency department today because of concern for seizure like activity. The patient states that she has had similar episodes in the past and that she has been told she has stress related seizures. She is on medication for this and states she hasn't missed any doses. Daughter at bedside states the patient was very stressed out before getting in the shower. She then came in to find the patient on the ground, she did have some shaking and the daughter helped her up onto the toilet. She stated the whole episode lasted maybe 15 minutes and then her mother was back to normal. Patient states she also has not been sleeping well recently.   Records reviewed. Per medical record review patient has a history of seizure.   Past Medical History:  Diagnosis Date   Seizure [facility])     Patient Active Problem List   Diagnosis Date Noted   Seizure disorder (HCC) 08/12/2020   Seizures (HCC) 07/16/2020   Transient neurological symptoms 07/14/2020   Petechial rash 07/14/2020   Convulsions (HCC)    Seizure (HCC) 07/13/2020   Pain in limb 02/08/2020   Swelling of limb 02/08/2020    Past Surgical History:  Procedure Laterality Date   CHOLECYSTECTOMY     TUBAL LIGATION      Prior to Admission medications   Medication Sig Start Date End Date Taking? Authorizing Provider  divalproex (DEPAKOTE) 500 MG DR tablet Take 1 tablet (500 mg total) by mouth daily at 12 noon. 08/26/20   Levert Feinstein, MD  EPINEPHrine 0.3 mg/0.3 mL IJ SOAJ injection Inject 0.3 mLs (0.3 mg total) into the muscle as needed for anaphylaxis. 11/04/19   Jene Every, MD  LamoTRIgine (LAMICTAL XR) 25 MG TB24 24 hour tablet 1  every night  for one week,  2 every night  for one week,  3 every night  for one week,  4 every night  for one week,  5 every night  for one week,  6 every night  for one week,  7 every night  for one week, 08/12/20   Levert Feinstein, MD  LamoTRIgine 200 MG TB24 24 hour tablet Take 1 tablet (200 mg total) by mouth at bedtime. 08/12/20   Levert Feinstein, MD    Allergies Bee venom and Penicillins  Family History  Problem Relation Age of Onset   Seizures Son        Epilepsy   Seizures Son        Febrile seizures   Lung cancer Mother    Other Father        unknown    Social History Social History   Tobacco Use   Smoking status: Some Days    Packs/day: 0.50    Years: 23.00    Pack years: 11.50    Types: Cigarettes   Smokeless tobacco: Never   Tobacco comments:    Smoking since age of 45 years old  Substance Use Topics   Alcohol use: Not Currently    Comment: Drinks once a month with husband   Drug use: Not Currently    Types: Cocaine    Comment: Reports last use 2008 after conception of last child    Review  of Systems Constitutional: No fever/chills Eyes: No visual changes. ENT: No sore throat. Cardiovascular: Denies chest pain. Respiratory: Denies shortness of breath. Gastrointestinal: No abdominal pain.  No nausea, no vomiting.  No diarrhea.   Genitourinary: Negative for dysuria. Musculoskeletal: Negative for back pain. Skin: Negative for rash. Neurological: Negative for headaches, focal weakness or numbness.  ____________________________________________   PHYSICAL EXAM:  VITAL SIGNS: ED Triage Vitals  Enc Vitals Group     BP 10/21/20 2042 (!) 130/91     Pulse Rate 10/21/20 2042 74     Resp 10/21/20 2042 20     Temp 10/21/20 2042 98.9 F (37.2 C)     Temp Source 10/21/20 2042 Oral     SpO2 10/21/20 2023 98 %     Weight 10/21/20 2034 150 lb (68 kg)     Height 10/21/20 2034 5\' 4"  (1.626 m)     Head Circumference --      Peak Flow --      Pain Score 10/21/20  2034 6    Constitutional: Alert and oriented.  Eyes: Conjunctivae are normal.  ENT      Head: Normocephalic and atraumatic.      Nose: No congestion/rhinnorhea.      Mouth/Throat: Mucous membranes are moist.      Neck: No stridor. No midline tenderness.  Hematological/Lymphatic/Immunilogical: No cervical lymphadenopathy. Cardiovascular: Normal rate, regular rhythm.  No murmurs, rubs, or gallops.  Respiratory: Normal respiratory effort without tachypnea nor retractions. Breath sounds are clear and equal bilaterally. No wheezes/rales/rhonchi. Gastrointestinal: Soft and non tender. No rebound. No guarding.  Genitourinary: Deferred Musculoskeletal: Normal range of motion in all extremities. No lower extremity edema. No spinal tenderness. Neurologic:  Normal speech and language. No gross focal neurologic deficits are appreciated.  Skin:  Skin is warm, dry and intact. No rash noted. Psychiatric: Mood and affect are normal. Speech and behavior are normal. Patient exhibits appropriate insight and judgment.  ____________________________________________    LABS (pertinent positives/negatives)  CMP wnl CBC wbc 14.0, hgb 13.7, plt 266  ____________________________________________   EKG  None  ____________________________________________    RADIOLOGY  None  ____________________________________________   PROCEDURES  Procedures  ____________________________________________   INITIAL IMPRESSION / ASSESSMENT AND PLAN / ED COURSE  Pertinent labs & imaging results that were available during my care of the patient were reviewed by me and considered in my medical decision making (see chart for details).   Patient presents to the emergency department today because of concern for seizure like episode. On exam no focal neuro deficits. Patient blood work without concerning electrolyte abnormality. Did discuss with patient importance of neurology follow up. Also discussed seizure  precautions.   ____________________________________________   FINAL CLINICAL IMPRESSION(S) / ED DIAGNOSES  Final diagnoses:  Seizure-like activity (HCC)     Note: This dictation was prepared with Dragon dictation. Any transcriptional errors that result from this process are unintentional     2035, MD 10/21/20 2321

## 2020-10-21 NOTE — Discharge Instructions (Addendum)
Please seek medical attention for any high fevers, chest pain, shortness of breath, change in behavior, persistent vomiting, bloody stool or any other new or concerning symptoms.  

## 2020-10-21 NOTE — ED Triage Notes (Signed)
Pt brought in via ems from home.  Pt reports having a seizure in the shower tonight.  Pt has a hematoma to back of head and pt reports a headache.  Pt reports taking her seizure meds.  Iv in place.  Pt alert.

## 2020-10-23 LAB — LAMOTRIGINE LEVEL: Lamotrigine Lvl: 3.6 ug/mL (ref 2.0–20.0)

## 2020-10-27 ENCOUNTER — Other Ambulatory Visit: Payer: Self-pay | Admitting: Neurology
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
## 2020-12-02 ENCOUNTER — Encounter: Payer: Self-pay | Admitting: Psychiatry

## 2020-12-02 ENCOUNTER — Other Ambulatory Visit: Payer: Self-pay

## 2020-12-02 ENCOUNTER — Telehealth (INDEPENDENT_AMBULATORY_CARE_PROVIDER_SITE_OTHER): Payer: Medicaid Other | Admitting: Psychiatry

## 2020-12-02 DIAGNOSIS — F4310 Post-traumatic stress disorder, unspecified: Secondary | ICD-10-CM | POA: Diagnosis not present

## 2020-12-02 DIAGNOSIS — F332 Major depressive disorder, recurrent severe without psychotic features: Secondary | ICD-10-CM | POA: Diagnosis not present

## 2020-12-02 MED ORDER — SERTRALINE HCL 50 MG PO TABS: ORAL_TABLET | ORAL | 1 refills | Status: DC

## 2020-12-02 NOTE — Patient Instructions (Signed)
Start sertraline 25 mg daily for one week, then 50 mg daily  Next appointment- 10/11 at 11 AM , video Referral to therapy

## 2020-12-31 ENCOUNTER — Other Ambulatory Visit: Payer: Self-pay | Admitting: Neurology

## 2021-01-08 ENCOUNTER — Telehealth (INDEPENDENT_AMBULATORY_CARE_PROVIDER_SITE_OTHER): Payer: Medicaid Other | Admitting: Neurology

## 2021-01-08 ENCOUNTER — Telehealth: Payer: Self-pay | Admitting: Neurology

## 2021-01-08 ENCOUNTER — Telehealth: Payer: Self-pay | Admitting: *Deleted

## 2021-01-08 DIAGNOSIS — G43709 Chronic migraine without aura, not intractable, without status migrainosus: Secondary | ICD-10-CM

## 2021-01-08 DIAGNOSIS — G40909 Epilepsy, unspecified, not intractable, without status epilepticus: Secondary | ICD-10-CM

## 2021-01-08 MED ORDER — UBRELVY 100 MG PO TABS: ORAL_TABLET | ORAL | 3 refills | Status: DC

## 2021-01-08 MED ORDER — TIZANIDINE HCL 4 MG PO TABS: 4.0000 mg | ORAL_TABLET | Freq: Four times a day (QID) | ORAL | 6 refills | Status: DC | PRN

## 2021-01-08 MED ORDER — ONDANSETRON 4 MG PO TBDP: 4.0000 mg | ORAL_TABLET | Freq: Three times a day (TID) | ORAL | 3 refills | Status: DC | PRN

## 2021-01-08 MED ORDER — BUTALBITAL-APAP-CAFFEINE 50-325-40 MG PO TABS: 1.0000 | ORAL_TABLET | Freq: Four times a day (QID) | ORAL | 0 refills | Status: DC | PRN

## 2021-01-08 NOTE — Patient Instructions (Signed)
Meds ordered this encounter  Medications   butalbital-acetaminophen-caffeine (FIORICET) 50-325-40 MG tablet    Sig: Take 1 tablet by mouth every 6 (six) hours as needed for headache.    Dispense:  12 tablet    Refill:  0   Ubrogepant (UBRELVY) 100 MG TABS    Sig: Take 1 tab at onset of migraine.  May repeat in 2 hrs, if needed.  Max dose: 2 tabs/day. This is a 30 day prescription.    Dispense:  15 tablet    Refill:  3   ondansetron (ZOFRAN ODT) 4 MG disintegrating tablet    Sig: Take 1 tablet (4 mg total) by mouth every 8 (eight) hours as needed for nausea or vomiting.    Dispense:  20 tablet    Refill:  3   tiZANidine (ZANAFLEX) 4 MG tablet    Sig: Take 1 tablet (4 mg total) by mouth every 6 (six) hours as needed.    Dispense:  30 tablet    Refill:  6     You may take either Fioricet or Bernita Raisin  (if your insurance pay for it), for severe migraine headache, you may combine with Zofran for nausea, tizanidine as muscle relaxant, even over-the-counter Aleve 1 to 2 tablets for severe prolonged migraine, you may consider adding Benadryl 25 mg 1 to 2 tablet, and go to sleep, warm compression

## 2021-01-08 NOTE — Telephone Encounter (Signed)
I spoke to Dr. Terrace Arabia. She would like the patient added to her schedule as a mychart visit. I spoke to both the patient and daughter (they are together). She has been connected w/ Dr. Terrace Arabia.
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
## 2021-01-08 NOTE — Telephone Encounter (Signed)
PA for Ubrelvy 50mg  started on covermymeds (key: [id number]). Pt has pharmacy coverage through [employer] [phone]). Decision pending.

## 2021-01-08 NOTE — Telephone Encounter (Signed)
Pt along with daughter(not on DPR) called stressing that pt is having a horrible headache and is on right side of head in back.  Daughter stated pt possibly had a seizure earlier this week. Daughter asking pt be called but also stated she may take pt to ED if pt continues to worsen.
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
## 2021-01-13 ENCOUNTER — Encounter: Payer: Self-pay | Admitting: Psychiatry

## 2021-01-13 ENCOUNTER — Other Ambulatory Visit: Payer: Self-pay

## 2021-01-13 ENCOUNTER — Telehealth (INDEPENDENT_AMBULATORY_CARE_PROVIDER_SITE_OTHER): Payer: Medicaid Other | Admitting: Psychiatry

## 2021-01-13 DIAGNOSIS — F332 Major depressive disorder, recurrent severe without psychotic features: Secondary | ICD-10-CM

## 2021-01-13 DIAGNOSIS — F4310 Post-traumatic stress disorder, unspecified: Secondary | ICD-10-CM

## 2021-01-13 MED ORDER — SERTRALINE HCL 100 MG PO TABS: 100.0000 mg | ORAL_TABLET | Freq: Every day | ORAL | 0 refills | Status: DC

## 2021-01-13 NOTE — Telephone Encounter (Signed)
Ubrelvy 100mg  tablet Denied.  May be considered for approval when you have had a headache frequency of less than 15 headache days per month during the prior 6 months. If you would like to speak to the clinician/physician who made this decision, please call [phone]. PA #: [id number].

## 2021-01-14 ENCOUNTER — Encounter: Payer: Self-pay | Admitting: *Deleted

## 2021-01-14 NOTE — Telephone Encounter (Signed)
Melanie from [facility] called stating that they are missing the pt's consent form to move forward. Please fax to [phone]

## 2021-01-14 NOTE — Telephone Encounter (Signed)
I was able to reach the patient. Signature obtained for the required consent form. It was faxed along with the appeal letter and chart notes to [facility] for review.

## 2021-01-14 NOTE — Telephone Encounter (Signed)
I spoke to the patient. She is actually reporting less than 15 headache days each month. This denial will be appealed. Letter written for request to overturn the decision has been faxed and confirmed to [phone].  Status can be checked with [facility] at [phone].

## 2021-01-14 NOTE — Telephone Encounter (Signed)
Per [facility], the patient will have to sign an appeals request form. They will not take the consent verbally. Also, chart notes will need to be sent for review.   I left a message for the patient to return my call.

## 2021-01-16 ENCOUNTER — Telehealth: Payer: Self-pay | Admitting: Neurology

## 2021-01-16 NOTE — Telephone Encounter (Signed)
Melanie @ [employer] has called re: an expedited appeal on [place].  It has been approved start approval date is from 01-16-21 billing units 15 and dose is 30.

## 2021-01-19 ENCOUNTER — Encounter: Payer: Self-pay | Admitting: *Deleted

## 2021-01-19 NOTE — Telephone Encounter (Signed)
Noted. Patient was sent a mychart message with this update.

## 2021-01-28 NOTE — Telephone Encounter (Signed)
Error
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
## 2021-02-05 ENCOUNTER — Other Ambulatory Visit: Payer: Self-pay

## 2021-02-05 ENCOUNTER — Telehealth (INDEPENDENT_AMBULATORY_CARE_PROVIDER_SITE_OTHER): Payer: Medicaid Other | Admitting: Psychiatry

## 2021-02-05 ENCOUNTER — Telehealth: Payer: Self-pay | Admitting: Neurology

## 2021-02-05 ENCOUNTER — Encounter: Payer: Self-pay | Admitting: Psychiatry

## 2021-02-05 DIAGNOSIS — G4700 Insomnia, unspecified: Secondary | ICD-10-CM | POA: Diagnosis not present

## 2021-02-05 DIAGNOSIS — F331 Major depressive disorder, recurrent, moderate: Secondary | ICD-10-CM | POA: Diagnosis not present

## 2021-02-05 DIAGNOSIS — F4310 Post-traumatic stress disorder, unspecified: Secondary | ICD-10-CM

## 2021-02-05 MED ORDER — SERTRALINE HCL 100 MG PO TABS: 150.0000 mg | ORAL_TABLET | Freq: Every day | ORAL | 1 refills | Status: DC

## 2021-02-05 NOTE — Patient Instructions (Signed)
Increase sertraline 150 mg daily Next appointment: 1/11 at 8 AM

## 2021-02-05 NOTE — Telephone Encounter (Signed)
LVM & sent mychart msg informing pt of 11/28 cancellation, Maralyn Sago out of office.

## 2021-02-19 ENCOUNTER — Institutional Professional Consult (permissible substitution): Payer: Medicaid Other | Admitting: Internal Medicine

## 2021-03-02 ENCOUNTER — Ambulatory Visit: Payer: Medicaid Other | Admitting: Neurology

## 2021-03-16 ENCOUNTER — Other Ambulatory Visit: Payer: Self-pay | Admitting: Neurology

## 2021-03-16 ENCOUNTER — Other Ambulatory Visit: Payer: Self-pay | Admitting: Psychiatry

## 2021-04-01 ENCOUNTER — Other Ambulatory Visit: Payer: Self-pay | Admitting: Psychiatry
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
## 2021-04-15 ENCOUNTER — Telehealth: Payer: Medicaid Other | Admitting: Psychiatry

## 2021-04-15 ENCOUNTER — Telehealth: Payer: Self-pay | Admitting: Psychiatry

## 2021-04-15 ENCOUNTER — Other Ambulatory Visit: Payer: Self-pay

## 2021-04-15 NOTE — Telephone Encounter (Signed)
Sent link for video visit through Epic. Patient did not sign in. Called the patient for appointment scheduled today. The patient did not answer the phone. Left voice message to contact the office ([phone]).   ?

## 2021-04-16 ENCOUNTER — Other Ambulatory Visit: Payer: Self-pay | Admitting: Neurology

## 2021-04-16 ENCOUNTER — Encounter: Payer: Self-pay | Admitting: *Deleted

## 2021-04-23 ENCOUNTER — Other Ambulatory Visit: Payer: Self-pay | Admitting: Neurology

## 2021-04-23 ENCOUNTER — Telehealth: Payer: Self-pay | Admitting: Neurology

## 2021-04-23 ENCOUNTER — Other Ambulatory Visit: Payer: Self-pay | Admitting: Psychiatry

## 2021-04-23 NOTE — Telephone Encounter (Signed)
I spoke to the patient. States she was given the incorrect dosage of lamotrigine (given the previous amt - 25mg  used for titration). We actually denied a request  from the pharmacy for refill of that rx today. She has been on 200mg . I called the pharmacy and they are going to fix the problem. The patient is aware and will return the medication to the pharmacy and continue the current, correct dosage (lamotrigine 200mg  TB24, 24 hour, one tab QHS).

## 2021-04-23 NOTE — Telephone Encounter (Signed)
I called pt. No answer, left a message asking pt to call me back.   

## 2021-04-23 NOTE — Telephone Encounter (Signed)
Dr.Hisada's patient °

## 2021-04-23 NOTE — Telephone Encounter (Signed)
Pt called with questions about her LamoTRIgine 200 MG TB24 24 hour tablet dosage. Please advise.

## 2021-04-30 ENCOUNTER — Other Ambulatory Visit: Payer: Self-pay | Admitting: Psychiatry

## 2021-05-01 NOTE — Telephone Encounter (Signed)
Dr.Hisada's patient °

## 2021-05-01 NOTE — Telephone Encounter (Signed)
Ordered sertraline refill per request from the pharmacy. Could you contact her and make a follow up appointment? I will not be able to order any more refills without evaluation.

## 2021-05-20 ENCOUNTER — Other Ambulatory Visit: Payer: Self-pay | Admitting: Neurology

## 2021-05-21 NOTE — Telephone Encounter (Signed)
Rx request for butalbital-acetaminophen-caffeine (FIORICET) 50-325-40 MG. Last filled: 03/17/2021 QTY: 12 tablets Last visit: 08/12/2020 Next visit: 07/20/2021

## 2021-05-29 ENCOUNTER — Other Ambulatory Visit: Payer: Self-pay | Admitting: Psychiatry

## 2021-06-07 ENCOUNTER — Other Ambulatory Visit: Payer: Self-pay | Admitting: Psychiatry
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
## 2021-06-18 ENCOUNTER — Encounter: Payer: Self-pay | Admitting: Psychiatry

## 2021-06-18 ENCOUNTER — Telehealth (INDEPENDENT_AMBULATORY_CARE_PROVIDER_SITE_OTHER): Payer: Medicaid Other | Admitting: Psychiatry

## 2021-06-18 ENCOUNTER — Other Ambulatory Visit: Payer: Self-pay

## 2021-06-18 DIAGNOSIS — F4310 Post-traumatic stress disorder, unspecified: Secondary | ICD-10-CM | POA: Diagnosis not present

## 2021-06-18 DIAGNOSIS — F331 Major depressive disorder, recurrent, moderate: Secondary | ICD-10-CM | POA: Diagnosis not present

## 2021-06-18 MED ORDER — SERTRALINE HCL 100 MG PO TABS: 150.0000 mg | ORAL_TABLET | Freq: Every day | ORAL | 1 refills | Status: DC

## 2021-06-18 MED ORDER — QUETIAPINE FUMARATE 25 MG PO TABS: 25.0000 mg | ORAL_TABLET | Freq: Every day | ORAL | 1 refills | Status: DC

## 2021-06-18 NOTE — Patient Instructions (Signed)
Continue sertraline 150 mg daily ?Start quetiapine 25 mg at night  ?Next appointment: 5/8 at 3 PM, in person ? ?The next visit will be in person visit. Please arrive 15 mins before the scheduled time.  ? ?[facility]  ?Address: [address], [place], [place] [address]   ?

## 2021-06-26 NOTE — Telephone Encounter (Signed)
Pt had appt on 3-16 and has a follow up on 5-8- ?

## 2021-07-20 ENCOUNTER — Ambulatory Visit: Payer: Medicaid Other | Admitting: Neurology

## 2021-07-20 ENCOUNTER — Encounter: Payer: Self-pay | Admitting: Neurology

## 2021-08-06 ENCOUNTER — Other Ambulatory Visit: Payer: Self-pay | Admitting: Psychiatry

## 2021-08-08 NOTE — Progress Notes (Signed)
BH MD/PA/NP OP Progress Note ? ?08/10/2021 3:40 PM ?Bonnie FiscalLori A Stone  ?MRN:  [id number] ? ?Chief Complaint:  ?Chief Complaint  ?Patient presents with  ? Follow-up  ? ?HPI:  ?This is a follow-up appointment for depression, insomnia.  ?She states that her son and her daughter-in-law lost the house, and moving into her place.  She also now takes care of her 1768-month-old grandson as her daughter has issues with drug.  She enjoys taking a walk with grandchildren.  She does not go outside otherwise as she does not feel comfortable.  She tends to have panic attacks when she goes to the grocery store.  She states that in person visit is different than the video visit.  She cannot hide things.  Although she tries to look normal, it took an extra effort due to her mood symptoms.  She has depressive symptoms as in PHQ-9.  She has SIB of cutting her body when she feels overwhelmed, although she denies SI.  She has AH of voices arguing.  She also has CAH of harming others. She has VH of black cat. Although she reports occasional HI to others, she denies any intention, and she is scared of being "crazy." She also reports HI against her daughter due to her leaving the children, although she loves her.  She does not elaborate any plans.  She denies gun access at home.  she agrees to contact emergency resources if any worsening or if she is unsure whether she acts on HI.  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  She sleeps better with quetiapine without side effect.  She feels comfortable to try higher dose of quetiapine at this time.  ? ? ?Wt Readings from Last 3 Encounters:  ?08/10/21 148 lb 6.4 oz (67.3 kg)  ?10/21/20 150 lb (68 kg)  ?08/12/20 141 lb 8 oz (64.2 kg)  ?  ? ?Daily routine: takes care of her granddaughter (age 24),  ?Exercise: ?Employment: unemployed. Used to work until her mother had cancer, who deceased in 2016 ?Support: limited ?Household: husband (out of town  most of the time due to his work as Hospital doctorHVAC), 2 children (including the youngest 1215, 121) ?Marital status: married ?Number of children: 4 ? ?Visit Diagnosis:  ?  ICD-10-CM   ?1. PTSD (post-traumatic stress disorder)  F43.10   ?  ?2. MDD (major depressive disorder), recurrent episode, moderate (HCC)  F33.1   ?  ? ? ?Past Psychiatric History: Please see initial evaluation for full details. I have reviewed the history. No updates at this time.  ?  ? ?Past Medical History:  ?Past Medical History:  ?Diagnosis Date  ? Seizure (HCC)   ?  ?Past Surgical History:  ?Procedure Laterality Date  ? CHOLECYSTECTOMY    ? TUBAL LIGATION    ? ? ?Family Psychiatric History: Please see initial evaluation for full details. I have reviewed the history. No updates at this time.  ?  ? ?Family History:  ?Family History  ?Problem Relation Age of Onset  ? Seizures Son   ?     Epilepsy  ? Seizures Son   ?     Febrile seizures  ? Lung cancer Mother   ? Other Father   ?     unknown  ? ? ?Social History:  ?Social History  ? ?Socioeconomic History  ? Marital status: Married  ?  Spouse name: Not on file  ? Number of  children: 4  ? Years of education: 76  ? Highest education level: High school graduate  ?Occupational History  ? Occupation: homemaker  ?Tobacco Use  ? Smoking status: Some Days  ?  Packs/day: 0.50  ?  Years: 23.00  ?  Pack years: 11.50  ?  Types: Cigarettes  ? Smokeless tobacco: Never  ? Tobacco comments:  ?  Smoking since age of 46 years old  ?Substance and Sexual Activity  ? Alcohol use: Not Currently  ?  Comment: Drinks once a month with husband  ? Drug use: Not Currently  ?  Types: Cocaine  ?  Comment: Reports last use 2008 after conception of last child  ? Sexual activity: Yes  ?  Partners: Male  ?Other Topics Concern  ? Not on file  ?Social History Narrative  ? Lives at home with her family.  ? 3-4 cups caffeine per day.  ? Right-handed.  ? ?Social Determinants of Health  ? ?Financial Resource Strain: Not on file  ?Food Insecurity:  Not on file  ?Transportation Needs: Not on file  ?Physical Activity: Not on file  ?Stress: Not on file  ?Social Connections: Not on file  ? ? ?Allergies:  ?Allergies  ?Allergen Reactions  ? Bee Venom Anaphylaxis  ? Penicillins   ? ? ?Metabolic Disorder Labs: ?Lab Results  ?Component Value Date  ? HGBA1C 5.5 07/14/2020  ? MPG 111.15 07/14/2020  ? ?No results found for: PROLACTIN ?No results found for: CHOL, TRIG, HDL, CHOLHDL, VLDL, LDLCALC ?Lab Results  ?Component Value Date  ? TSH 0.971 07/14/2020  ? ? ?Therapeutic Level Labs: ?No results found for: LITHIUM ?No results found for: VALPROATE ?No components found for:  CBMZ ? ?Current Medications: ?Current Outpatient Medications  ?Medication Sig Dispense Refill  ? butalbital-acetaminophen-caffeine (FIORICET) 50-325-40 MG tablet TAKE 1 TABLET BY MOUTH EVERY 6 HOURS AS NEEDED FOR HEADACHE 12 tablet 3  ? EPINEPHrine 0.3 mg/0.3 mL IJ SOAJ injection Inject 0.3 mLs (0.3 mg total) into the muscle as needed for anaphylaxis. 1 each 1  ? LamoTRIgine 200 MG TB24 24 hour tablet Take 1 tablet (200 mg total) by mouth at bedtime. 90 tablet 4  ? ondansetron (ZOFRAN ODT) 4 MG disintegrating tablet Take 1 tablet (4 mg total) by mouth every 8 (eight) hours as needed for nausea or vomiting. 20 tablet 3  ? tiZANidine (ZANAFLEX) 4 MG tablet Take 1 tablet (4 mg total) by mouth every 6 (six) hours as needed. 30 tablet 6  ? Ubrogepant (UBRELVY) 100 MG TABS Take 1 tab at onset of migraine.  May repeat in 2 hrs, if needed.  Max dose: 2 tabs/day. This is a 30 day prescription. 15 tablet 3  ? QUEtiapine (SEROQUEL) 50 MG tablet Take 1 tablet (50 mg total) by mouth at bedtime. 30 tablet 1  ? [START ON 08/18/2021] sertraline (ZOLOFT) 100 MG tablet Take 1.5 tablets (150 mg total) by mouth daily. 45 tablet 1  ? ?No current facility-administered medications for this visit.  ? ? ? ?Musculoskeletal: ?Strength & Muscle Tone: within normal limits ?Gait & Station: normal ?Patient leans: N/A ? ?Psychiatric  Specialty Exam: ?Review of Systems  ?Psychiatric/Behavioral:  Positive for decreased concentration, dysphoric mood, hallucinations, sleep disturbance and suicidal ideas. Negative for agitation, behavioral problems, confusion and self-injury. The patient is nervous/anxious. The patient is not hyperactive.   ?All other systems reviewed and are negative.  ?Blood pressure 129/77, pulse 92, temperature 98.3 ?F (36.8 ?C), temperature source Temporal, weight 148 lb 6.4 oz (  67.3 kg), last menstrual period 03/16/2017.Body mass index is 25.47 kg/m?.  ?General Appearance: Fairly Groomed  ?Eye Contact:  Good  ?Speech:  Clear and Coherent  ?Volume:  Normal  ?Mood:  Anxious and Depressed  ?Affect:  Appropriate, Congruent, and Tearful  ?Thought Process:  Coherent  ?Orientation:  Full (Time, Place, and Person)  ?Thought Content: Logical   ?Suicidal Thoughts:  No reports SIB of cutting   ?Homicidal Thoughts:  Yes.  without intent/plan  ?Memory:  Immediate;   Good  ?Judgement:  Good  ?Insight:  Fair  ?Psychomotor Activity:  Normal  ?Concentration:  Concentration: Good and Attention Span: Good  ?Recall:  Good  ?Fund of Knowledge: Good  ?Language: Good  ?Akathisia:  No  ?Handed:  Right  ?AIMS (if indicated): not done  ?Assets:  Communication Skills ?Desire for Improvement  ?ADL's:  Intact  ?Cognition: WNL  ?Sleep:  Fair  ? ?Screenings: ?PHQ2-9   ? ?Flowsheet Row Office Visit from 08/10/2021 in [facility] Video Visit from 02/05/2021 in [facility] Video Visit from 12/02/2020 in [facility]  ?PHQ-2 Total Score 3 3 6   ?PHQ-9 Total Score 14 14 22   ? ?  ? ?Flowsheet Row Office Visit from 08/10/2021 in [facility] Video Visit from 02/05/2021 in [facility] Video Visit from 12/02/2020 in [facility]  ?C-SSRS RISK CATEGORY Error: Q3, 4, or 5 should not be populated when Q2 is No  Error: Question 6 not populated Error: Q3, 4, or 5 should not be populated when Q2 is No  ? ?  ? ? ? ?Assessment and Plan:  ?Bonnie Stone is a 46 y.o. year old female with a history of depression, anxie

## 2021-08-10 ENCOUNTER — Encounter: Payer: Self-pay | Admitting: Psychiatry

## 2021-08-10 ENCOUNTER — Ambulatory Visit (INDEPENDENT_AMBULATORY_CARE_PROVIDER_SITE_OTHER): Payer: Medicaid Other | Admitting: Psychiatry

## 2021-08-10 VITALS — BP 129/77 | HR 92 | Temp 98.3°F | Wt 148.4 lb

## 2021-08-10 DIAGNOSIS — F331 Major depressive disorder, recurrent, moderate: Secondary | ICD-10-CM

## 2021-08-10 DIAGNOSIS — F4310 Post-traumatic stress disorder, unspecified: Secondary | ICD-10-CM | POA: Diagnosis not present

## 2021-08-10 MED ORDER — QUETIAPINE FUMARATE 50 MG PO TABS: 50.0000 mg | ORAL_TABLET | Freq: Every day | ORAL | 1 refills | Status: DC

## 2021-08-10 MED ORDER — SERTRALINE HCL 100 MG PO TABS: 150.0000 mg | ORAL_TABLET | Freq: Every day | ORAL | 1 refills | Status: DC

## 2021-08-10 NOTE — Patient Instructions (Signed)
Continue sertraline 150 mg daily ?Increase quetiapine 50 mg at night  ?Next appointment: 6/12 at 8:30, in person ?

## 2021-08-11 ENCOUNTER — Telehealth: Payer: Self-pay

## 2021-08-11 NOTE — Telephone Encounter (Signed)
Medication management - Telephone call with pharmacist, Joe, at pt's [facility] to inform of approved prior authorization for patient's prescribed Quetiapine 50 mg tablets until 08/11/22, [id number].  Collateral verified medication was being filled as ordered.  ?

## 2021-08-11 NOTE — Telephone Encounter (Signed)
went online to [email or web address] and submitted the prior auth for the Quetiapine it was approved. pa case # [id number] approved from 08-11-21 to 08-11-22 ?

## 2021-08-30 ENCOUNTER — Other Ambulatory Visit: Payer: Self-pay | Admitting: Neurology

## 2021-09-01 ENCOUNTER — Other Ambulatory Visit: Payer: Self-pay | Admitting: Neurology

## 2021-09-01 NOTE — Telephone Encounter (Signed)
Butalbital-acetaminophen-caffeine (FIORICET) 50-325-40 MG Last Filled: 05/21/2021 Quantity: 12 tablets with 3 refills Last appointment: 01/08/2021 Next appointment: N/A

## 2021-09-02 NOTE — Telephone Encounter (Signed)
Rx refilled.
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
## 2021-09-14 ENCOUNTER — Encounter: Payer: Self-pay | Admitting: Psychiatry

## 2021-09-14 ENCOUNTER — Ambulatory Visit (INDEPENDENT_AMBULATORY_CARE_PROVIDER_SITE_OTHER): Payer: Medicaid Other | Admitting: Psychiatry

## 2021-09-14 VITALS — BP 151/80 | HR 81 | Temp 98.1°F | Wt 155.0 lb

## 2021-09-14 DIAGNOSIS — F4310 Post-traumatic stress disorder, unspecified: Secondary | ICD-10-CM | POA: Diagnosis not present

## 2021-09-14 DIAGNOSIS — F332 Major depressive disorder, recurrent severe without psychotic features: Secondary | ICD-10-CM | POA: Diagnosis not present

## 2021-09-14 MED ORDER — ARIPIPRAZOLE 2 MG PO TABS: 2.0000 mg | ORAL_TABLET | Freq: Every evening | ORAL | 1 refills | Status: DC

## 2021-09-14 NOTE — Patient Instructions (Signed)
Continue sertraline 150 mg daily Start abilify 2 mg a tnight  Discontinue quetiapine Next appointment: 7/18 at 11:30, in person

## 2021-09-27 ENCOUNTER — Other Ambulatory Visit: Payer: Self-pay | Admitting: Neurology

## 2021-10-01 ENCOUNTER — Ambulatory Visit: Payer: Medicaid Other | Admitting: Neurology

## 2021-10-07 ENCOUNTER — Other Ambulatory Visit: Payer: Self-pay | Admitting: Psychiatry

## 2021-10-08 ENCOUNTER — Telehealth: Payer: Self-pay | Admitting: *Deleted

## 2021-10-08 NOTE — Telephone Encounter (Signed)
Start prior PA for Aripiprazole 2 mg tablet.   Aripiprazole 2 mg tablet been approved 10/08/21-10/08/22 PA case #[id number].   Unable to LVM to pt-not in service . Faxed approval to [id number] - [place], [place] - [address] AT [facility]

## 2021-10-12 ENCOUNTER — Other Ambulatory Visit: Payer: Self-pay | Admitting: Psychiatry

## 2021-10-12 ENCOUNTER — Telehealth: Payer: Self-pay

## 2021-10-12 MED ORDER — SERTRALINE HCL 100 MG PO TABS: 150.0000 mg | ORAL_TABLET | Freq: Every day | ORAL | 2 refills | Status: DC

## 2021-10-12 NOTE — Telephone Encounter (Signed)
Ordered

## 2021-10-12 NOTE — Telephone Encounter (Signed)
Pt called needs refills on the zoloft will not have enough to get to next appt Pt last seen on 09-14-21 next appt 10-20-21   sertraline (ZOLOFT) 100 MG tablet Medication Date: 08/10/2021 Department: [facility] Ordering/Authorizing: Neysa Hotter, MD   Order Providers  Prescribing Provider Encounter Provider  Neysa Hotter, MD Neysa Hotter, MD   Outpatient Medication Detail   Disp Refills Start End   sertraline (ZOLOFT) 100 MG tablet 45 tablet 1 08/18/2021 10/17/2021   Sig - Route: Take 1.5 tablets (150 mg total) by mouth daily. - Oral   Sent to pharmacy as: sertraline (ZOLOFT) 100 MG tablet   E-Prescribing Status: Receipt confirmed by pharmacy (08/10/2021  3:35 PM EDT)
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
## 2021-10-20 ENCOUNTER — Ambulatory Visit (INDEPENDENT_AMBULATORY_CARE_PROVIDER_SITE_OTHER): Payer: Medicaid Other | Admitting: Psychiatry

## 2021-10-20 ENCOUNTER — Encounter: Payer: Self-pay | Admitting: Psychiatry

## 2021-10-20 VITALS — BP 137/71 | HR 90 | Temp 98.7°F | Wt 157.0 lb

## 2021-10-20 DIAGNOSIS — F4310 Post-traumatic stress disorder, unspecified: Secondary | ICD-10-CM | POA: Diagnosis not present

## 2021-10-20 DIAGNOSIS — F332 Major depressive disorder, recurrent severe without psychotic features: Secondary | ICD-10-CM

## 2021-10-20 MED ORDER — ARIPIPRAZOLE 5 MG PO TABS: 5.0000 mg | ORAL_TABLET | Freq: Every day | ORAL | 0 refills | Status: DC

## 2021-10-20 NOTE — Patient Instructions (Signed)
Continue sertraline 150 mg daily Increase Abilify 5 mg at night   Contact [facility] [employer] [phone]) , and ask if they take your insurance. Paulo Fruit, Fri 8 AM-2PM. [address], [place].  You can walk in for a crisis assessment and referrals to additional services. Appointments are not needed.  Next appointment: 8/15 at 11:30, in person

## 2021-10-22 NOTE — Telephone Encounter (Signed)
prior auth approved for aripiprazole 2mg  Case #  approved from 10-08-21 to 10-08-22

## 2021-10-30 ENCOUNTER — Other Ambulatory Visit: Payer: Self-pay | Admitting: Family Medicine

## 2021-10-30 DIAGNOSIS — Z1231 Encounter for screening mammogram for malignant neoplasm of breast: Secondary | ICD-10-CM
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
## 2021-11-17 ENCOUNTER — Ambulatory Visit (INDEPENDENT_AMBULATORY_CARE_PROVIDER_SITE_OTHER): Payer: Medicaid Other | Admitting: Psychiatry

## 2021-11-17 ENCOUNTER — Encounter: Payer: Self-pay | Admitting: Psychiatry

## 2021-11-17 VITALS — BP 119/83 | HR 81 | Temp 98.1°F | Wt 161.6 lb

## 2021-11-17 DIAGNOSIS — F4310 Post-traumatic stress disorder, unspecified: Secondary | ICD-10-CM | POA: Diagnosis not present

## 2021-11-17 DIAGNOSIS — F332 Major depressive disorder, recurrent severe without psychotic features: Secondary | ICD-10-CM

## 2021-11-17 MED ORDER — ARIPIPRAZOLE 10 MG PO TABS
10.0000 mg | ORAL_TABLET | Freq: Every day | ORAL | 1 refills | Status: DC
Start: 2021-11-21 — End: 2021-12-31

## 2021-11-19 ENCOUNTER — Other Ambulatory Visit: Payer: Self-pay | Admitting: Neurology

## 2021-12-30 ENCOUNTER — Other Ambulatory Visit: Payer: Self-pay | Admitting: Psychiatry

## 2021-12-30 ENCOUNTER — Other Ambulatory Visit: Payer: Self-pay | Admitting: Neurology

## 2021-12-31 ENCOUNTER — Ambulatory Visit: Payer: Medicaid Other | Admitting: Neurology

## 2021-12-31 ENCOUNTER — Encounter: Payer: Self-pay | Admitting: Neurology

## 2021-12-31 VITALS — BP 116/75 | HR 73 | Ht 64.0 in | Wt 166.0 lb

## 2021-12-31 DIAGNOSIS — G40909 Epilepsy, unspecified, not intractable, without status epilepticus: Secondary | ICD-10-CM | POA: Diagnosis not present

## 2021-12-31 DIAGNOSIS — G43709 Chronic migraine without aura, not intractable, without status migrainosus: Secondary | ICD-10-CM

## 2021-12-31 DIAGNOSIS — R410 Disorientation, unspecified: Secondary | ICD-10-CM | POA: Diagnosis not present

## 2021-12-31 MED ORDER — LAMOTRIGINE ER 300 MG PO TB24: 300.0000 mg | ORAL_TABLET | Freq: Every evening | ORAL | 3 refills | Status: DC

## 2021-12-31 NOTE — Progress Notes (Signed)
Chief Complaint  Patient presents with   Follow-up    Rm 14. Accompanied by husband, Richard. Reports recent seizure activity. Seizures last for a few seconds to a few minutes. Husband reports postictal stage last a few hours. Pt reports a few "bad headaches" a week.       ASSESSMENT AND PLAN  Bonnie Stone is a 46 y.o. female   Increased seizure frequency More frequent headaches, history of migraine headaches,  While taking lamotrigine ER 200 mg every night  Also complicated by her worsening stress, depression anxiety  Reported weird smells prior to onset, suggestive of complex partial seizure  Repeat EEG  Increase lamotrigine ER to 300 mg every night  Check level  Return to clinic with nurse practitioner in 3 to 4 months      DIAGNOSTIC DATA (LABS, IMAGING, TESTING) - I reviewed patient records, labs, notes, testing and imaging myself where available.  MRI of the brain without contrast July 13 2020:: 1. No acute intracranial abnormality. 2. Multifocal hyperintense T2-weighted signal within the brainstem, which may be a sequela of chronic small vessel ischemia.  CT HEAD on July 16, 2020   Normal head CT.  No acute intracranial abnormality.   CTA HEAD AND NECK IMPRESSION:   Normal CTA of the head and neck. No large vessel occlusion, hemodynamically significant stenosis, or other acute vascular abnormality. No aneurysm.   CT VENOGRAM IMPRESSION:   Normal CT venogram.  No evidence for dural sinus thrombosis.   Laboratory evaluation April 2022, CMP, normal creatinine 0.73, albumin 3.4, total protein 6.0, normal CBC, B12, magnesium, alcohol less than 10, normal TSH, A1c 5.5, negative HIV, normal CPK 67,  negative RPR, cocaine was + July 17, 2020, that was confirmed, serum drug screen and urine drug screen was also positive for marijuana THC, negative hepatitis C,  CSF on July 14, 2020, negative for HSV type I/II,, varicella-zoster PCR, negative culture, WBC 0,  RBC 1, protein 27, glucose 59,    Video-EEG monitoring April 13-14 2022. This study is suggestive of mild to moderate diffuse encephalopathy, nonspecific etiology. The excessive beta activity seen in the background is most likely due to the effect of medications like benzodiazepine and is a benign EEG pattern. No seizures or epileptiform discharges were seen throughout the recording.   Multiple events were captured as described above without concomitant eeg change and were NOT epileptic.     HISTORICAL  Bonnie Stone is a 46 year old female, seen in request by her primary care physician Dr.   Andrena Mews T for evaluation of seizure, initial evaluation was with her husband on Aug 12, 2020  I reviewed and summarized the referring note.  Past medical history Illicit drug use, UDS was positive for marijuana and cocaine, Smoking Mood disorder, but was never treated,  On July 13, 2020, she was grocery shopping with her 47 year old daughter, on her way to check out, she felt dizzy, was able to manage to the parking lot, then lost consciousness, daughter reported weakness generalized tonic-clonic activity, she woke up with paramedics around her, postevent confusion  She was admitted to the hospital, had extensive evaluations, MRI of the brain showed multifocal small vessel disease, mainly involving brainstem, no acute abnormality, CT angiogram of head and neck, CT venogram of the brain showed no significant abnormality  UDS was positive for marijuana  She was discharged to home on July 16, 2020, husband reported she was noted to be very weak, needs to be supported back  to home, the same day of discharge few hours later, she was noted to have recurrent seizure-like spells, patient was able to prescribe she often has the aura of smelling ammonia before the onset of body jerking movement,  She also had spinal tap July 14, 2020, there was no significant abnormality found, but the second UDS on  April 14 was positive for both marijuana and cocaine,   Virtual Visit via video Location: Provider: [facility] office; Patient: Home I connected with Bonnie Stone  on Jan 08 2021 by a video enabled telemedicine application and verified that I am speaking with the correct person using two identifiers.  UPDATE  She is with her daughter at home, confirmed the medication lamotrigine ER 200 mg every night, Zoloft 50 mg daily, reported transient loss of consciousness, that was witnessed by daughter, patient has no recollection of the event,  But today her main concern is 3 days history of severe right side headache, she did have a history of migraine in the past, also has mild headaches, take over-the-counter medications, has not had severe headache like this for many months, she complains of light noise sensitivity, nauseous, failed multiple home medications,  Reviewed MRI of the brain in April 2022, multifocal small vessel disease, not a good candidate for triptan treatment,  UPDATE Sept 28 2023: She is accompanied by her husband at today's clinical visit, complains of increased headache stress recently, there was increased frequency of transient confusion spells, has been able to see it, glazed look in her eyes, lasting for few minutes, followed by extreme confusion, patient often smelled burnt popcorn or some weird smells prior to symptom onset, it can happen up to twice a week    REVIEW OF SYSTEMS:  Full 14 system review of systems performed and notable only for as above All other review of systems were negative.  PHYSICAL EXAM:  Today's Vitals   12/31/21 0856  BP: 116/75  Pulse: 73  Weight: 166 lb (75.3 kg)  Height: 5\' 4"  (1.626 m)   Body mass index is 28.49 kg/m.   PHYSICAL EXAMNIATION:  Gen: NAD, conversant, well nourised, well groomed                     Cardiovascular: Regular rate rhythm, no peripheral edema, warm, nontender. Eyes: Conjunctivae clear without exudates or  hemorrhage Neck: Supple, no carotid bruits. Pulmonary: Clear to auscultation bilaterally   NEUROLOGICAL EXAM:  MENTAL STATUS: Speech/cognition: Depressed looking middle-age female, alert oriented to history taking and casual conversation  CRANIAL NERVES: CN II: Visual fields are full to confrontation.  Pupils are round equal and briskly reactive to light. CN III, IV, VI: extraocular movement are normal. No ptosis. CN V: Facial sensation is intact to pinprick in all 3 divisions bilaterally. Corneal responses are intact.  CN VII: Face is symmetric with normal eye closure and smile. CN VIII: Hearing is normal to casual conversation CN IX, X: Palate elevates symmetrically. Phonation is normal. CN XI: Head turning and shoulder shrug are intact   MOTOR: There is no pronator drift of out-stretched arms. Muscle bulk and tone are normal. Muscle strength is normal.  REFLEXES: Reflexes are 2+ and symmetric at the biceps, triceps, knees, and ankles. Plantar responses are flexor.  SENSORY: Intact to light touch, pinprick, positional and vibratory sensation are intact in fingers and toes.  COORDINATION: There is no dysmetria on finger-to-nose and heel-knee-shin.    GAIT/STANCE: Posture is normal. Gait is steady    Allergy:  Allergies  Allergen Reactions   Bee Venom Anaphylaxis   Penicillins     HOME MEDICATIONS: Current Outpatient Medications  Medication Sig Dispense Refill   ARIPiprazole (ABILIFY) 5 MG tablet Take 1 tablet (5 mg total) by mouth daily. 30 tablet 0   butalbital-acetaminophen-caffeine (FIORICET) 50-325-40 MG tablet TAKE 1 TABLET BY MOUTH EVERY 6 HOURS AS NEEDED FOR HEADACHE 12 tablet 3   EPINEPHrine 0.3 mg/0.3 mL IJ SOAJ injection Inject 0.3 mLs (0.3 mg total) into the muscle as needed for anaphylaxis. 1 each 1   LamoTRIgine 200 MG TB24 24 hour tablet TAKE 1 TABLET BY MOUTH EVERYDAY AT BEDTIME 30 tablet 3   ondansetron (ZOFRAN ODT) 4 MG disintegrating tablet Take 1  tablet (4 mg total) by mouth every 8 (eight) hours as needed for nausea or vomiting. 20 tablet 3   sertraline (ZOLOFT) 100 MG tablet Take 1.5 tablets (150 mg total) by mouth daily. 45 tablet 2   tiZANidine (ZANAFLEX) 4 MG tablet TAKE 1 TABLET BY MOUTH EVERY 6 HOURS AS NEEDED 30 tablet 6   Ubrogepant (UBRELVY) 100 MG TABS Take 1 tab at onset of migraine.  May repeat in 2 hrs, if needed.  Max dose: 2 tabs/day. This is a 30 day prescription. (Patient not taking: Reported on 12/31/2021) 15 tablet 3   No current facility-administered medications for this visit.    PAST MEDICAL HISTORY: Past Medical History:  Diagnosis Date   Seizure (HCC)     PAST SURGICAL HISTORY: Past Surgical History:  Procedure Laterality Date   CHOLECYSTECTOMY     TUBAL LIGATION      FAMILY HISTORY: Family History  Problem Relation Age of Onset   Seizures Son        Epilepsy   Seizures Son        Febrile seizures   Lung cancer Mother    Other Father        unknown    SOCIAL HISTORY: Social History   Socioeconomic History   Marital status: Married    Spouse name: Not on file   Number of children: 4   Years of education: 12   Highest education level: High school graduate  Occupational History   Occupation: homemaker  Tobacco Use   Smoking status: Some Days    Packs/day: 0.50    Years: 23.00    Total pack years: 11.50    Types: Cigarettes   Smokeless tobacco: Never   Tobacco comments:    Smoking since age of 46 years old  Substance and Sexual Activity   Alcohol use: Not Currently    Comment: Drinks once a month with husband   Drug use: Not Currently    Types: Cocaine    Comment: Reports last use 2008 after conception of last child   Sexual activity: Yes    Partners: Male  Other Topics Concern   Not on file  Social History Narrative   Lives at home with her family.   3-4 cups caffeine per day.   Right-handed.   Social Determinants of Health   Financial Resource Strain: Not on file   Food Insecurity: Not on file  Transportation Needs: Not on file  Physical Activity: Not on file  Stress: Not on file  Social Connections: Not on file  Intimate Partner Violence: Not on file      Levert Feinstein, M.D. Ph.D.  [facility] [address] [place], [place] [address] Ph: [phone] Fax: [phone]  CC:  Hamrick, Durward Fortes,  MD [address] [place],  [place] [address]  Hamrick, Durward Fortes, MD
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
## 2022-01-04 ENCOUNTER — Encounter: Payer: Self-pay | Admitting: Psychiatry

## 2022-01-04 ENCOUNTER — Ambulatory Visit (INDEPENDENT_AMBULATORY_CARE_PROVIDER_SITE_OTHER): Payer: Medicaid Other | Admitting: Psychiatry

## 2022-01-04 VITALS — BP 128/85 | HR 78 | Temp 98.5°F | Wt 169.2 lb

## 2022-01-04 DIAGNOSIS — F331 Major depressive disorder, recurrent, moderate: Secondary | ICD-10-CM

## 2022-01-04 DIAGNOSIS — F4310 Post-traumatic stress disorder, unspecified: Secondary | ICD-10-CM

## 2022-01-04 MED ORDER — SERTRALINE HCL 100 MG PO TABS: 200.0000 mg | ORAL_TABLET | Freq: Every day | ORAL | 2 refills | Status: DC

## 2022-01-04 MED ORDER — ARIPIPRAZOLE 10 MG PO TABS: 10.0000 mg | ORAL_TABLET | Freq: Every day | ORAL | 0 refills | Status: DC

## 2022-01-06 ENCOUNTER — Other Ambulatory Visit: Payer: Medicaid Other | Admitting: *Deleted

## 2022-01-06 ENCOUNTER — Ambulatory Visit (INDEPENDENT_AMBULATORY_CARE_PROVIDER_SITE_OTHER): Payer: Medicaid Other | Admitting: Neurology

## 2022-01-06 DIAGNOSIS — R410 Disorientation, unspecified: Secondary | ICD-10-CM

## 2022-01-06 DIAGNOSIS — G40909 Epilepsy, unspecified, not intractable, without status epilepticus: Secondary | ICD-10-CM

## 2022-01-06 DIAGNOSIS — G43709 Chronic migraine without aura, not intractable, without status migrainosus: Secondary | ICD-10-CM

## 2022-01-11 ENCOUNTER — Telehealth: Payer: Self-pay | Admitting: Neurology

## 2022-01-11 LAB — COMPREHENSIVE METABOLIC PANEL
ALT: 15 IU/L (ref 0–32)
AST: 16 IU/L (ref 0–40)
Albumin/Globulin Ratio: 2 (ref 1.2–2.2)
Albumin: 4.9 g/dL (ref 3.9–4.9)
Alkaline Phosphatase: 117 IU/L (ref 44–121)
BUN/Creatinine Ratio: 16 (ref 9–23)
BUN: 12 mg/dL (ref 6–24)
Bilirubin Total: <0.2 mg/dL (ref 0.0–1.2)
CO2: 26 mmol/L (ref 20–29)
Calcium: 10 mg/dL (ref 8.7–10.2)
Chloride: 101 mmol/L (ref 96–106)
Creatinine, Ser: 0.74 mg/dL (ref 0.57–1.00)
Globulin, Total: 2.4 g/dL (ref 1.5–4.5)
Glucose: 81 mg/dL (ref 70–99)
Potassium: 4.4 mmol/L (ref 3.5–5.2)
Sodium: 141 mmol/L (ref 134–144)
Total Protein: 7.3 g/dL (ref 6.0–8.5)
eGFR: 100 mL/min/{1.73_m2} (ref >59)

## 2022-01-11 LAB — CBC WITH DIFFERENTIAL
Basophils Absolute: 0.1 10*3/uL (ref 0.0–0.2)
Basos: 1 %
EOS (ABSOLUTE): 0.3 10*3/uL (ref 0.0–0.4)
Eos: 4 %
Hematocrit: 44.5 % (ref 34.0–46.6)
Hemoglobin: 14.5 g/dL (ref 11.1–15.9)
Immature Grans (Abs): 0.1 10*3/uL (ref 0.0–0.1)
Immature Granulocytes: 1 %
Lymphocytes Absolute: 2.6 10*3/uL (ref 0.7–3.1)
Lymphs: 30 %
MCH: 31.1 pg (ref 26.6–33.0)
MCHC: 32.6 g/dL (ref 31.5–35.7)
MCV: 96 fL (ref 79–97)
Monocytes Absolute: 0.7 10*3/uL (ref 0.1–0.9)
Monocytes: 8 %
Neutrophils Absolute: 4.8 10*3/uL (ref 1.4–7.0)
Neutrophils: 56 %
RBC: 4.66 x10E6/uL (ref 3.77–5.28)
RDW: 11.8 % (ref 11.7–15.4)
WBC: 8.6 10*3/uL (ref 3.4–10.8)

## 2022-01-11 LAB — THC,MS,WB/SP RFX
Cannabidiol: NEGATIVE ng/mL
Cannabinoid Confirmation: POSITIVE
Carboxy-THC: 31.9 ng/mL
Hydroxy-THC: 1.1 ng/mL

## 2022-01-11 LAB — DRUG SCREEN 10 W/CONF, SERUM
Amphetamines, IA: NEGATIVE ng/mL
Barbiturates, IA: NEGATIVE ug/mL
Benzodiazepines, IA: NEGATIVE ng/mL
Cocaine & Metabolite, IA: NEGATIVE ng/mL
Methadone, IA: NEGATIVE ng/mL
Opiates, IA: NEGATIVE ng/mL
Oxycodones, IA: NEGATIVE ng/mL
Phencyclidine, IA: NEGATIVE ng/mL
Propoxyphene, IA: NEGATIVE ng/mL
THC(Marijuana) Metabolite, IA: POSITIVE ng/mL — AB

## 2022-01-11 LAB — BARBITURATES,MS,WB/SP RFX
Amobarbital: NEGATIVE ug/mL
Barbiturates Confirmation: NEGATIVE
Butabarbital: NEGATIVE ug/mL
Butalbital: NEGATIVE ug/mL
Pentobarbital: NEGATIVE ug/mL
Phenobarbital: NEGATIVE ug/mL
Secobarbital: NEGATIVE ug/mL

## 2022-01-11 LAB — LAMOTRIGINE LEVEL: Lamotrigine Lvl: 2.9 ug/mL (ref 2.0–20.0)

## 2022-01-11 LAB — TSH: TSH: 1.73 u[IU]/mL (ref 0.450–4.500)

## 2022-01-11 NOTE — Telephone Encounter (Signed)
Pt is requesting EEG results from 01/06/2022. Results not available as of today. Will forward to MD for review.

## 2022-01-11 NOTE — Telephone Encounter (Signed)
Pt is calling. Stated she would like to talk to a nurse about getting MRI results. Pr is requesting a call back from nurse.

## 2022-01-13 ENCOUNTER — Emergency Department (HOSPITAL_COMMUNITY)
Admission: EM | Admit: 2022-01-13 | Discharge: 2022-01-13 | Disposition: A | Discharge status: Home / Self Care | Payer: Medicaid Other | Attending: Student | Admitting: Student

## 2022-01-13 ENCOUNTER — Emergency Department (HOSPITAL_COMMUNITY): Payer: Medicaid Other

## 2022-01-13 ENCOUNTER — Other Ambulatory Visit: Payer: Self-pay

## 2022-01-13 ENCOUNTER — Encounter (HOSPITAL_COMMUNITY): Payer: Self-pay | Admitting: Emergency Medicine

## 2022-01-13 DIAGNOSIS — R112 Nausea with vomiting, unspecified: Secondary | ICD-10-CM | POA: Diagnosis not present

## 2022-01-13 DIAGNOSIS — R519 Headache, unspecified: Secondary | ICD-10-CM | POA: Diagnosis not present

## 2022-01-13 DIAGNOSIS — R569 Unspecified convulsions: Secondary | ICD-10-CM | POA: Diagnosis present

## 2022-01-13 DIAGNOSIS — Z5321 Procedure and treatment not carried out due to patient leaving prior to being seen by health care provider: Secondary | ICD-10-CM | POA: Insufficient documentation

## 2022-01-13 LAB — COMPREHENSIVE METABOLIC PANEL
ALT: 21 U/L (ref 0–44)
AST: 23 U/L (ref 15–41)
Albumin: 4.3 g/dL (ref 3.5–5.0)
Alkaline Phosphatase: 102 U/L (ref 38–126)
Anion gap: 12 (ref 5–15)
BUN: 7 mg/dL (ref 6–20)
CO2: 21 mmol/L — ABNORMAL LOW (ref 22–32)
Calcium: 9.5 mg/dL (ref 8.9–10.3)
Chloride: 108 mmol/L (ref 98–111)
Creatinine, Ser: 0.71 mg/dL (ref 0.44–1.00)
GFR, Estimated: >60 mL/min (ref >60)
Glucose, Bld: 100 mg/dL — ABNORMAL HIGH (ref 70–99)
Potassium: 3.4 mmol/L — ABNORMAL LOW (ref 3.5–5.1)
Sodium: 141 mmol/L (ref 135–145)
Total Bilirubin: 0.2 mg/dL — ABNORMAL LOW (ref 0.3–1.2)
Total Protein: 7.4 g/dL (ref 6.5–8.1)

## 2022-01-13 LAB — CBC WITH DIFFERENTIAL/PLATELET
Abs Immature Granulocytes: 0.05 10*3/uL (ref 0.00–0.07)
Basophils Absolute: 0.1 10*3/uL (ref 0.0–0.1)
Basophils Relative: 1 %
Eosinophils Absolute: 0.3 10*3/uL (ref 0.0–0.5)
Eosinophils Relative: 3 %
HCT: 44.1 % (ref 36.0–46.0)
Hemoglobin: 15 g/dL (ref 12.0–15.0)
Immature Granulocytes: 1 %
Lymphocytes Relative: 34 %
Lymphs Abs: 3.4 10*3/uL (ref 0.7–4.0)
MCH: 32.4 pg (ref 26.0–34.0)
MCHC: 34 g/dL (ref 30.0–36.0)
MCV: 95.2 fL (ref 80.0–100.0)
Monocytes Absolute: 0.7 10*3/uL (ref 0.1–1.0)
Monocytes Relative: 7 %
Neutro Abs: 5.5 10*3/uL (ref 1.7–7.7)
Neutrophils Relative %: 54 %
Platelets: 262 10*3/uL (ref 150–400)
RBC: 4.63 MIL/uL (ref 3.87–5.11)
RDW: 12.9 % (ref 11.5–15.5)
WBC: 10 10*3/uL (ref 4.0–10.5)
nRBC: 0 % (ref 0.0–0.2)

## 2022-01-13 LAB — MAGNESIUM: Magnesium: 1.9 mg/dL (ref 1.7–2.4)

## 2022-01-13 NOTE — ED Notes (Signed)
Called x2 and no response. Pt cannot find pt in lobby

## 2022-01-13 NOTE — ED Triage Notes (Signed)
Pt reported to ED with c/o of seizure activity today. Does not remember events prior to, during or after event or duration of event. Reports hx of the same. Pt states she has recently had increase in seizure medication.

## 2022-01-13 NOTE — ED Provider Triage Note (Signed)
Emergency Medicine Provider Triage Evaluation Note  Bonnie Stone , a 46 y.o. female  was evaluated in triage.  Pt complains of seizure.  History of seizures.  Recently had her medications increased.  Patient reports a headache but denies any head trauma.  Seizure was witnessed by her husband.  Patient reports nausea and vomiting.  Denies any fevers or chills..  Review of Systems  Positive: Seizure, headache, nausea, vomiting Negative: Neck pain, chest pain, shortness of breath  Physical Exam  BP (!) 137/94 (BP Location: Right Arm)   Pulse 81   Temp 98.1 F (36.7 C) (Oral)   Resp 16   LMP 03/16/2017 (Approximate)   SpO2 100%  Gen:   Awake, no distress   Resp:  Normal effort  MSK:   Moves extremities without difficulty  Other:  The patient is alert, attentive, and oriented x 3. Speech is clear. Cranial nerve II-VII grossly intact. Negative pronator drift. Sensation intact. Strength 5/5 in all extremities.  Rapid alternating movement and fine finger movements intact. Romberg is absent.   No nuchal rigidity.  Heart regular rate and rhythm.  Lungs clear to auscultation bilaterally.  Medical Decision Making  Medically screening exam initiated at 7:21 PM.  Appropriate orders placed.  Dru A Mancha was informed that the remainder of the evaluation will be completed by another provider, this initial triage assessment does not replace that evaluation, and the importance of remaining in the ED until their evaluation is complete.  Patient with seizure today.  History of seizures.  Patient reports headache.  Has taken medications as prescribed.  Reports nausea and vomiting.   Doristine Devoid, PA-C 01/13/22 1922

## 2022-01-14 NOTE — Procedures (Signed)
   HISTORY: 46 year old female complains of increased seizure frequency.  TECHNIQUE:  This is a routine 16 channel EEG recording with one channel devoted to a limited EKG recording.  It was performed during wakefulness, drowsiness and asleep.  Hyperventilation and photic stimulation were performed as activating procedures.  There are minimum muscle and movement artifact noted.  Upon maximum arousal, posterior dominant waking rhythm consistent of rhythmic alpha range activity, there was also noticeable smaller fast beta activity. Activities are symmetric over the bilateral posterior derivations and attenuated with eye opening.  Hyperventilation produced mild/moderate buildup with higher amplitude and the slower activities noted.  Photic stimulation did not alter the tracing.  During EEG recording, patient developed drowsiness and no deeper stage of sleep was achieved.  During EEG recording, there was no epileptiform discharge noted.  EKG demonstrate sinus rhythm, with heart rate of 76 bpm  CONCLUSION: This is a  normal awake EEG.  There is no electrodiagnostic evidence of epileptiform discharge.  Marcial Pacas, M.D. Ph.D.  [facility] [place], [place] [address] Phone: [phone] Fax:      [phone]

## 2022-01-14 NOTE — Telephone Encounter (Signed)
Please call patient EEG was normal 

## 2022-01-15 NOTE — Telephone Encounter (Signed)
Pt notified of results via mychart see note from 01/14/2022.

## 2022-02-01 ENCOUNTER — Encounter (INDEPENDENT_AMBULATORY_CARE_PROVIDER_SITE_OTHER): Payer: Self-pay
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
## 2022-02-15 ENCOUNTER — Ambulatory Visit: Payer: Medicaid Other | Admitting: Psychiatry

## 2022-02-26 ENCOUNTER — Other Ambulatory Visit: Payer: Self-pay | Admitting: Psychiatry

## 2022-02-27 ENCOUNTER — Other Ambulatory Visit: Payer: Self-pay | Admitting: Neurology

## 2022-02-28 NOTE — Telephone Encounter (Signed)
Ordered refill of Abilify per request. Please contact the patient to make follow up appointment.
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
## 2022-03-09 ENCOUNTER — Ambulatory Visit: Payer: Medicaid Other | Admitting: Psychiatry
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
## 2022-03-11 ENCOUNTER — Encounter: Payer: Self-pay | Admitting: Psychiatry

## 2022-03-11 ENCOUNTER — Ambulatory Visit (INDEPENDENT_AMBULATORY_CARE_PROVIDER_SITE_OTHER): Payer: Medicaid Other | Admitting: Psychiatry

## 2022-03-11 VITALS — BP 149/97 | HR 99 | Temp 97.3°F | Ht 64.0 in | Wt 176.6 lb

## 2022-03-11 DIAGNOSIS — F4310 Post-traumatic stress disorder, unspecified: Secondary | ICD-10-CM | POA: Diagnosis not present

## 2022-03-11 DIAGNOSIS — F332 Major depressive disorder, recurrent severe without psychotic features: Secondary | ICD-10-CM | POA: Diagnosis not present

## 2022-03-11 NOTE — Patient Instructions (Signed)
Decrease sertraline 150 mg daily (limited benefit from 200 mg) Obtain EKG Increase Abilify to 15 mg at night after EKG is reviewed Next appointment: 1/30 at 4 PM

## 2022-03-18 ENCOUNTER — Ambulatory Visit: Payer: Medicaid Other | Attending: Family Medicine

## 2022-03-30 ENCOUNTER — Other Ambulatory Visit: Payer: Self-pay | Admitting: Psychiatry

## 2022-04-07 ENCOUNTER — Other Ambulatory Visit: Payer: Self-pay | Admitting: Psychiatry

## 2022-04-09 ENCOUNTER — Ambulatory Visit
Admission: RE | Admit: 2022-04-09 | Discharge: 2022-04-09 | Disposition: A | Discharge status: Home / Self Care | Payer: Medicaid Other | Source: Ambulatory Visit | Attending: Psychiatry | Admitting: Psychiatry

## 2022-04-09 DIAGNOSIS — F332 Major depressive disorder, recurrent severe without psychotic features: Secondary | ICD-10-CM | POA: Diagnosis present

## 2022-04-13 ENCOUNTER — Other Ambulatory Visit: Payer: Self-pay | Admitting: Psychiatry

## 2022-04-13 ENCOUNTER — Telehealth: Payer: Self-pay

## 2022-04-13 MED ORDER — SERTRALINE HCL 100 MG PO TABS: 150.0000 mg | ORAL_TABLET | Freq: Every day | ORAL | 2 refills | Status: DC

## 2022-04-13 NOTE — Telephone Encounter (Signed)
pt left message that you wanted her to decrease the sertraline but she forgot and now she out .

## 2022-04-13 NOTE — Telephone Encounter (Signed)
Sertraline 150 mg was sent to the pharmacy.

## 2022-04-14 NOTE — Telephone Encounter (Signed)
Pt.notified

## 2022-04-29 ENCOUNTER — Other Ambulatory Visit: Payer: Self-pay | Admitting: Psychiatry
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
## 2022-05-04 ENCOUNTER — Ambulatory Visit: Payer: Medicaid Other | Admitting: Psychiatry

## 2022-05-12 NOTE — Progress Notes (Deleted)
No chief complaint on file.      ASSESSMENT AND PLAN  Bonnie Stone is a 47 y.o. female   Increased seizure frequency More frequent headaches, history of migraine headaches,  While taking lamotrigine ER 200 mg every night  Also complicated by her worsening stress, depression anxiety  Reported weird smells prior to onset, suggestive of complex partial seizure  EEG 01/2023 unremarkable  Continue increased dose of lamotrigine ER to 300 mg every night     Return to clinic with nurse practitioner in 3 to 4 months      DIAGNOSTIC DATA (LABS, IMAGING, TESTING) - I reviewed patient records, labs, notes, testing and imaging myself where available.  EEG 01/06/2022 CONCLUSION: This is a  normal awake EEG.  There is no electrodiagnostic evidence of epileptiform discharge.   MRI of the brain without contrast July 13 2020:: 1. No acute intracranial abnormality. 2. Multifocal hyperintense T2-weighted signal within the brainstem, which may be a sequela of chronic small vessel ischemia.  CT HEAD on July 16, 2020   Normal head CT.  No acute intracranial abnormality.   CTA HEAD AND NECK IMPRESSION:   Normal CTA of the head and neck. No large vessel occlusion, hemodynamically significant stenosis, or other acute vascular abnormality. No aneurysm.   CT VENOGRAM IMPRESSION:   Normal CT venogram.  No evidence for dural sinus thrombosis.   Laboratory evaluation April 2022, CMP, normal creatinine 0.73, albumin 3.4, total protein 6.0, normal CBC, B12, magnesium, alcohol less than 10, normal TSH, A1c 5.5, negative HIV, normal CPK 67,  negative RPR, cocaine was + July 17, 2020, that was confirmed, serum drug screen and urine drug screen was also positive for marijuana THC, negative hepatitis C,  CSF on July 14, 2020, negative for HSV type I/II,, varicella-zoster PCR, negative culture, WBC 0, RBC 1, protein 27, glucose 59,    Video-EEG monitoring April 13-14 2022. This study is  suggestive of mild to moderate diffuse encephalopathy, nonspecific etiology. The excessive beta activity seen in the background is most likely due to the effect of medications like benzodiazepine and is a benign EEG pattern. No seizures or epileptiform discharges were seen throughout the recording.   Multiple events were captured as described above without concomitant eeg change and were NOT epileptic.     HISTORICAL   Consult visit 08/12/2020 Dr. Krista Blue: Bonnie Stone is a 47 year old female, seen in request by her primary care physician Dr.   Andrena Mews T for evaluation of seizure, initial evaluation was with her husband on Aug 12, 2020  I reviewed and summarized the referring note.  Past medical history Illicit drug use, UDS was positive for marijuana and cocaine, Smoking Mood disorder, but was never treated,  On July 13, 2020, she was grocery shopping with her 27 year old daughter, on her way to check out, she felt dizzy, was able to manage to the parking lot, then lost consciousness, daughter reported weakness generalized tonic-clonic activity, she woke up with paramedics around her, postevent confusion  She was admitted to the hospital, had extensive evaluations, MRI of the brain showed multifocal small vessel disease, mainly involving brainstem, no acute abnormality, CT angiogram of head and neck, CT venogram of the brain showed no significant abnormality  UDS was positive for marijuana  She was discharged to home on July 16, 2020, husband reported she was noted to be very weak, needs to be supported back to home, the same day of discharge few hours later, she was noted  to have recurrent seizure-like spells, patient was able to prescribe she often has the aura of smelling ammonia before the onset of body jerking movement,  She also had spinal tap July 14, 2020, there was no significant abnormality found, but the second UDS on April 14 was positive for both marijuana and  cocaine,   UPDATE 01/08/2021 Dr. Krista Blue: She is with her daughter at home, confirmed the medication lamotrigine ER 200 mg every night, Zoloft 50 mg daily, reported transient loss of consciousness, that was witnessed by daughter, patient has no recollection of the event,  But today her main concern is 3 days history of severe right side headache, she did have a history of migraine in the past, also has mild headaches, take over-the-counter medications, has not had severe headache like this for many months, she complains of light noise sensitivity, nauseous, failed multiple home medications,  Reviewed MRI of the brain in April 2022, multifocal small vessel disease, not a good candidate for triptan treatment,   UPDATE Sept 28 2023 Dr. Krista Blue: She is accompanied by her husband at today's clinical visit, complains of increased headache stress recently, there was increased frequency of transient confusion spells, has been able to see it, glazed look in her eyes, lasting for few minutes, followed by extreme confusion, patient often smelled burnt popcorn or some weird smells prior to symptom onset, it can happen up to twice a week    Update 05/13/2022 Bonnie Stone: Patient returns for 54-month follow-up.  Repeated EEG due to increased frequency of confusion spells which was unremarkable.  Lamotrigine dosage increased at prior visit.  She has continued on increased dose of 300 mg nightly, ***.       REVIEW OF SYSTEMS:  Full 14 system review of systems performed and notable only for as above All other review of systems were negative.  PHYSICAL EXAM:  There were no vitals filed for this visit.  There is no height or weight on file to calculate BMI.   PHYSICAL EXAMNIATION:  Gen: NAD, conversant, well nourised, well groomed                     Cardiovascular: Regular rate rhythm, no peripheral edema, warm, nontender. Eyes: Conjunctivae clear without exudates or hemorrhage Neck: Supple, no carotid  bruits. Pulmonary: Clear to auscultation bilaterally   NEUROLOGICAL EXAM:  MENTAL STATUS: Speech/cognition: Depressed looking middle-age female, alert oriented to history taking and casual conversation  CRANIAL NERVES: CN II: Visual fields are full to confrontation.  Pupils are round equal and briskly reactive to light. CN III, IV, VI: extraocular movement are normal. No ptosis. CN V: Facial sensation is intact to pinprick in all 3 divisions bilaterally. Corneal responses are intact.  CN VII: Face is symmetric with normal eye closure and smile. CN VIII: Hearing is normal to casual conversation CN IX, X: Palate elevates symmetrically. Phonation is normal. CN XI: Head turning and shoulder shrug are intact   MOTOR: There is no pronator drift of out-stretched arms. Muscle bulk and tone are normal. Muscle strength is normal.  REFLEXES: Reflexes are 2+ and symmetric at the biceps, triceps, knees, and ankles. Plantar responses are flexor.  SENSORY: Intact to light touch, pinprick, positional and vibratory sensation are intact in fingers and toes.  COORDINATION: There is no dysmetria on finger-to-nose and heel-knee-shin.    GAIT/STANCE: Posture is normal. Gait is steady    Allergy: Allergies  Allergen Reactions   Bee Venom Anaphylaxis   Penicillins  HOME MEDICATIONS: Current Outpatient Medications  Medication Sig Dispense Refill   ARIPiprazole (ABILIFY) 15 MG tablet Take 1 tablet (15 mg total) by mouth daily. 30 tablet 0   butalbital-acetaminophen-caffeine (FIORICET) 50-325-40 MG tablet TAKE 1 TABLET BY MOUTH EVERY 6 HOURS AS NEEDED FOR HEADACHE 12 tablet 3   EPINEPHrine 0.3 mg/0.3 mL IJ SOAJ injection Inject 0.3 mLs (0.3 mg total) into the muscle as needed for anaphylaxis. 1 each 1   LamoTRIgine 300 MG TB24 24 hour tablet Take 1 tablet (300 mg total) by mouth at bedtime. 90 tablet 3   nicotine (NICODERM CQ - DOSED IN MG/24 HR) 7 mg/24hr patch Place 7 mg onto the skin daily.      ondansetron (ZOFRAN ODT) 4 MG disintegrating tablet Take 1 tablet (4 mg total) by mouth every 8 (eight) hours as needed for nausea or vomiting. 20 tablet 3   rosuvastatin (CRESTOR) 10 MG tablet Take 10 mg by mouth at bedtime.     sertraline (ZOLOFT) 100 MG tablet Take 1.5 tablets (150 mg total) by mouth daily. 45 tablet 2   tiZANidine (ZANAFLEX) 4 MG tablet TAKE 1 TABLET BY MOUTH EVERY 6 HOURS AS NEEDED 30 tablet 6   Ubrogepant (UBRELVY) 100 MG TABS Take 1 tab at onset of migraine.  May repeat in 2 hrs, if needed.  Max dose: 2 tabs/day. This is a 30 day prescription. 15 tablet 3   No current facility-administered medications for this visit.    PAST MEDICAL HISTORY: Past Medical History:  Diagnosis Date   Seizure ([place])     PAST SURGICAL HISTORY: Past Surgical History:  Procedure Laterality Date   CHOLECYSTECTOMY     TUBAL LIGATION      FAMILY HISTORY: Family History  Problem Relation Age of Onset   Seizures Son        Epilepsy   Seizures Son        Febrile seizures   Lung cancer Mother    Other Father        unknown    SOCIAL HISTORY: Social History   Socioeconomic History   Marital status: Married    Spouse name: Not on file   Number of children: 4   Years of education: 12   Highest education level: High school graduate  Occupational History   Occupation: homemaker  Tobacco Use   Smoking status: Some Days    Packs/day: 0.50    Years: 23.00    Total pack years: 11.50    Types: Cigarettes   Smokeless tobacco: Never   Tobacco comments:    Smoking since age of 47 years old  Substance and Sexual Activity   Alcohol use: Not Currently    Comment: Drinks once a month with husband   Drug use: Not Currently    Types: Cocaine    Comment: Reports last use 2008 after conception of last child   Sexual activity: Yes    Partners: Male  Other Topics Concern   Not on file  Social History Narrative   Lives at home with her family.   3-4 cups caffeine per day.    Right-handed.   Social Determinants of Health   Financial Resource Strain: Not on file  Food Insecurity: Not on file  Transportation Needs: Not on file  Physical Activity: Not on file  Stress: Not on file  Social Connections: Not on file  Intimate Partner Violence: Not on file      I spent *** minutes of face-to-face and non-face-to-face time  with patient.  This included previsit chart review, lab review, study review, order entry, electronic health record documentation, patient education and discussion regarding the above diagnoses and treatment plan and answered all the questions to patient's satisfaction  Frann Rider, [facility]  [facility] [address] [place], [place] [address]  Phone [phone] Fax [phone] Note: This document was prepared with digital dictation and possible smart phrase technology. Any transcriptional errors that result from this process are unintentional.

## 2022-05-13 ENCOUNTER — Ambulatory Visit: Payer: Medicaid Other | Admitting: Adult Health

## 2022-05-24 ENCOUNTER — Ambulatory Visit: Payer: Medicaid Other | Admitting: Adult Health

## 2022-05-24 ENCOUNTER — Encounter: Payer: Self-pay | Admitting: Adult Health

## 2022-05-24 VITALS — BP 136/82 | HR 84 | Ht 63.0 in | Wt 177.8 lb

## 2022-05-24 DIAGNOSIS — G40909 Epilepsy, unspecified, not intractable, without status epilepticus: Secondary | ICD-10-CM | POA: Diagnosis not present

## 2022-05-24 DIAGNOSIS — G43709 Chronic migraine without aura, not intractable, without status migrainosus: Secondary | ICD-10-CM

## 2022-05-24 MED ORDER — GABAPENTIN 300 MG PO CAPS: 300.0000 mg | ORAL_CAPSULE | Freq: Every day | ORAL | 5 refills | Status: DC

## 2022-05-24 MED ORDER — UBRELVY 100 MG PO TABS: ORAL_TABLET | ORAL | 11 refills | Status: DC

## 2022-05-24 NOTE — Patient Instructions (Addendum)
Your Plan:  Continue lamotrigine 300 mg nightly  Start gabapentin 300 mg nightly - please call after 2 to 3 weeks if no benefit for dosage increase or sooner if any difficulty tolerating  Silvio Pate for rescue - do not take more than 2-3 times per week, can repeat x1 after 2 hrs if needed     Follow up in 4 months or call earlier if needed     Thank you for coming to see [place] at [facility]. I hope we have been able to provide you high quality care today.  You may receive a patient satisfaction survey over the next few weeks. We would appreciate your feedback and comments so that we may continue to improve ourselves and the health of our patients.

## 2022-05-24 NOTE — Progress Notes (Signed)
Chief Complaint  Patient presents with   Follow-up    Patient in room #3 and alone. Patient would like a refill for butalbital for her headaches.       ASSESSMENT AND PLAN  Bonnie Stone is a 47 y.o. female   Increased seizure frequency More frequent headaches, history of migraine headaches,  Continue lamotrigine ER 333m nightly as confusional episodes greatly improved  Recommend starting gabapentin 300 mg nightly for headaches and partial type seizures  Restart Ubrelvy for migraine rescue  Advised to monitor transient off-balance sensation which is chronic and some worsening over the past couple of weeks, if does not improve may consider further evaluation   Question underlying stress, depression and anxiety contributing to symptoms  EEG 01/2023 unremarkable    Follow-up in 4 months with Dr. YKrista Bluefor further recommendations or call earlier if needed      DIAGNOSTIC DATA (LABS, IMAGING, TESTING) - I reviewed patient records, labs, notes, testing and imaging myself where available.  EEG 01/06/2022 CONCLUSION: This is a  normal awake EEG.  There is no electrodiagnostic evidence of epileptiform discharge.   MRI of the brain without contrast July 13 2020:: 1. No acute intracranial abnormality. 2. Multifocal hyperintense T2-weighted signal within the brainstem, which may be a sequela of chronic small vessel ischemia.  CT HEAD on July 16, 2020   Normal head CT.  No acute intracranial abnormality.   CTA HEAD AND NECK IMPRESSION:   Normal CTA of the head and neck. No large vessel occlusion, hemodynamically significant stenosis, or other acute vascular abnormality. No aneurysm.   CT VENOGRAM IMPRESSION:   Normal CT venogram.  No evidence for dural sinus thrombosis.   Laboratory evaluation April 2022, CMP, normal creatinine 0.73, albumin 3.4, total protein 6.0, normal CBC, B12, magnesium, alcohol less than 10, normal TSH, A1c 5.5, negative HIV, normal CPK 67,   negative RPR, cocaine was + July 17, 2020, that was confirmed, serum drug screen and urine drug screen was also positive for marijuana THC, negative hepatitis C,  CSF on July 14, 2020, negative for HSV type I/II,, varicella-zoster PCR, negative culture, WBC 0, RBC 1, protein 27, glucose 59,    Video-EEG monitoring April 13-14 2022. This study is suggestive of mild to moderate diffuse encephalopathy, nonspecific etiology. The excessive beta activity seen in the background is most likely due to the effect of medications like benzodiazepine and is a benign EEG pattern. No seizures or epileptiform discharges were seen throughout the recording.   Multiple events were captured as described above without concomitant eeg change and were NOT epileptic.       HISTORICAL   Consult visit 08/12/2020 Dr. YKrista Blue Bonnie HASELEYis a 47year old female, seen in request by her primary care physician Dr.   EAndrena MewsT for evaluation of seizure, initial evaluation was with her husband on Aug 12, 2020  I reviewed and summarized the referring note.  Past medical history Illicit drug use, UDS was positive for marijuana and cocaine, Smoking Mood disorder, but was never treated,  On July 13, 2020, she was grocery shopping with her 289year old daughter, on her way to check out, she felt dizzy, was able to manage to the parking lot, then lost consciousness, daughter reported weakness generalized tonic-clonic activity, she woke up with paramedics around her, postevent confusion  She was admitted to the hospital, had extensive evaluations, MRI of the brain showed multifocal small vessel disease, mainly involving brainstem, no acute abnormality, CT angiogram of  head and neck, CT venogram of the brain showed no significant abnormality  UDS was positive for marijuana  She was discharged to home on July 16, 2020, husband reported she was noted to be very weak, needs to be supported back to home, the same day of  discharge few hours later, she was noted to have recurrent seizure-like spells, patient was able to prescribe she often has the aura of smelling ammonia before the onset of body jerking movement,  She also had spinal tap July 14, 2020, there was no significant abnormality found, but the second UDS on April 14 was positive for both marijuana and cocaine,   UPDATE 01/08/2021 Dr. Krista Blue: She is with her daughter at home, confirmed the medication lamotrigine ER 200 mg every night, Zoloft 50 mg daily, reported transient loss of consciousness, that was witnessed by daughter, patient has no recollection of the event,  But today her main concern is 3 days history of severe right side headache, she did have a history of migraine in the past, also has mild headaches, take over-the-counter medications, has not had severe headache like this for many months, she complains of light noise sensitivity, nauseous, failed multiple home medications,  Reviewed MRI of the brain in April 2022, multifocal small vessel disease, not a good candidate for triptan treatment,  UPDATE Sept 28 2023 Dr. Krista Blue: She is accompanied by her husband at today's clinical visit, complains of increased headache stress recently, there was increased frequency of transient confusion spells, has been able to see it, glazed look in her eyes, lasting for few minutes, followed by extreme confusion, patient often smelled burnt popcorn or some weird smells prior to symptom onset, it can happen up to twice a week     Update 05/13/2022 JM: Patient returns for 37-monthfollow-up unaccompanied.  Repeated EEG due to increased frequency of confusion spells which was unremarkable.  Lamotrigine dosage increased at prior visit.    She reports occasional confusional spells since prior visit but definitely improved since increasing lamotrigine dosage.  Tolerating current dosage well.  She describes these more recent events losing train of thought during conversation  but denies any extreme confusion events or smelling weird smell prior to symptom onset.  She does note chronic history of intermittently feeling off balance and leaning towards the left, has seemed to worsen slightly over the past 2 weeks, can last up to 1 hr then resolve.  Unable to identify any triggers.  Denies any recent medication changes.  She continues to experience headaches a couple times per week, prior use of Ubrelvy or Fioricet with benefit but has been out of refills.       REVIEW OF SYSTEMS:  Full 14 system review of systems performed and notable only for as above All other review of systems were negative.  PHYSICAL EXAM:  Today's Vitals   05/24/22 1331  BP: 136/82  Pulse: 84  Weight: 177 lb 12.8 oz (80.6 kg)  Height: 5' 3"$  (1.6 m)    Body mass index is 31.5 kg/m.   PHYSICAL EXAMNIATION:  Gen: NAD, conversant, well nourised, well groomed                     Cardiovascular: Regular rate rhythm, no peripheral edema, warm, nontender. Eyes: Conjunctivae clear without exudates or hemorrhage Neck: Supple, no carotid bruits. Pulmonary: Clear to auscultation bilaterally   NEUROLOGICAL EXAM:  MENTAL STATUS: Speech/cognition: Depressed looking middle-age female, alert oriented to history taking and casual conversation  CRANIAL NERVES: CN II: Visual fields are full to confrontation.  Pupils are round equal and briskly reactive to light. CN III, IV, VI: extraocular movement are normal. No ptosis. CN V: Facial sensation is intact to pinprick in all 3 divisions bilaterally. Corneal responses are intact.  CN VII: Face is symmetric with normal eye closure and smile. CN VIII: Hearing is normal to casual conversation CN IX, X: Palate elevates symmetrically. Phonation is normal. CN XI: Head turning and shoulder shrug are intact   MOTOR: There is no pronator drift of out-stretched arms. Muscle bulk and tone are normal. Muscle strength is normal.  REFLEXES: Reflexes are 2+  and symmetric at the biceps, triceps, knees, and ankles. Plantar responses are flexor.  SENSORY: Intact to light touch, pinprick, positional and vibratory sensation are intact in fingers and toes.  COORDINATION: There is no dysmetria on finger-to-nose and heel-knee-shin.    GAIT/STANCE: Posture is normal. Gait is steady    Allergy: Allergies  Allergen Reactions   Bee Venom Anaphylaxis   Penicillins     HOME MEDICATIONS: Current Outpatient Medications  Medication Sig Dispense Refill   ARIPiprazole (ABILIFY) 15 MG tablet Take 1 tablet (15 mg total) by mouth daily. 30 tablet 0   butalbital-acetaminophen-caffeine (FIORICET) 50-325-40 MG tablet TAKE 1 TABLET BY MOUTH EVERY 6 HOURS AS NEEDED FOR HEADACHE 12 tablet 3   EPINEPHrine 0.3 mg/0.3 mL IJ SOAJ injection Inject 0.3 mLs (0.3 mg total) into the muscle as needed for anaphylaxis. 1 each 1   LamoTRIgine 300 MG TB24 24 hour tablet Take 1 tablet (300 mg total) by mouth at bedtime. 90 tablet 3   nicotine (NICODERM CQ - DOSED IN MG/24 HR) 7 mg/24hr patch Place 7 mg onto the skin daily.     ondansetron (ZOFRAN ODT) 4 MG disintegrating tablet Take 1 tablet (4 mg total) by mouth every 8 (eight) hours as needed for nausea or vomiting. 20 tablet 3   rosuvastatin (CRESTOR) 10 MG tablet Take 10 mg by mouth at bedtime.     sertraline (ZOLOFT) 100 MG tablet Take 1.5 tablets (150 mg total) by mouth daily. 45 tablet 2   tiZANidine (ZANAFLEX) 4 MG tablet TAKE 1 TABLET BY MOUTH EVERY 6 HOURS AS NEEDED 30 tablet 6   Ubrogepant (UBRELVY) 100 MG TABS Take 1 tab at onset of migraine.  May repeat in 2 hrs, if needed.  Max dose: 2 tabs/day. This is a 30 day prescription. 15 tablet 3   No current facility-administered medications for this visit.    PAST MEDICAL HISTORY: Past Medical History:  Diagnosis Date   Seizure ([place])     PAST SURGICAL HISTORY: Past Surgical History:  Procedure Laterality Date   CHOLECYSTECTOMY     TUBAL LIGATION      FAMILY  HISTORY: Family History  Problem Relation Age of Onset   Seizures Son        Epilepsy   Seizures Son        Febrile seizures   Lung cancer Mother    Other Father        unknown    SOCIAL HISTORY: Social History   Socioeconomic History   Marital status: Married    Spouse name: Not on file   Number of children: 4   Years of education: 12   Highest education level: High school graduate  Occupational History   Occupation: homemaker  Tobacco Use   Smoking status: Some Days    Packs/day: 0.50    Years: 23.00  Total pack years: 11.50    Types: Cigarettes   Smokeless tobacco: Never   Tobacco comments:    Smoking since age of 47 years old  Substance and Sexual Activity   Alcohol use: Not Currently    Comment: Drinks once a month with husband   Drug use: Not Currently    Types: Cocaine    Comment: Reports last use 2008 after conception of last child   Sexual activity: Yes    Partners: Male  Other Topics Concern   Not on file  Social History Narrative   Lives at home with her family.   3-4 cups caffeine per day.   Right-handed.   Social Determinants of Health   Financial Resource Strain: Not on file  Food Insecurity: Not on file  Transportation Needs: Not on file  Physical Activity: Not on file  Stress: Not on file  Social Connections: Not on file  Intimate Partner Violence: Not on file      I spent 29 minutes of face-to-face and non-face-to-face time with patient.  This included previsit chart review, lab review, study review, order entry, electronic health record documentation, patient education and discussion regarding the above diagnoses and treatment plan and answered all the questions to patient's satisfaction  Frann Rider, [facility]  [facility] [address] [place], [place] [address]  Phone [phone] Fax [phone] Note: This document was prepared with digital dictation and possible smart phrase technology. Any  transcriptional errors that result from this process are unintentional.

## 2022-06-01 ENCOUNTER — Other Ambulatory Visit: Payer: Self-pay | Admitting: Psychiatry

## 2022-06-01 NOTE — Telephone Encounter (Signed)
Ordered Abilify per request. Please contact the patient to schedule a follow up appointment. Please also discuss attendance policy.

## 2022-06-04 NOTE — Telephone Encounter (Signed)
Message sent in my chart

## 2022-06-04 NOTE — Telephone Encounter (Signed)
Unable to leave message at phone number. Her mailbox is full

## 2022-06-04 NOTE — Telephone Encounter (Signed)
Could you please send her a message through my chart, or mail her to contact us for an appointment at her convenience? Thanks.

## 2022-06-23 ENCOUNTER — Telehealth: Payer: Self-pay | Admitting: Pharmacy Technician

## 2022-06-23 NOTE — Telephone Encounter (Signed)
Patient Advocate Encounter  Prior Authorization for Bonnie Stone 100MG  tablets has been approved.    PA# [id number] Key: [id number] Insurance [employer] [place] Electronic [place] Form Effective dates: 06/23/2022 through 06/23/2023      Lyndel Safe, [place] Patient Advocate Specialist [place] Patient Advocate Team Direct Number: [phone]  Fax: [phone]

## 2022-07-16 ENCOUNTER — Other Ambulatory Visit: Payer: Self-pay | Admitting: Psychiatry
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
## 2022-08-05 ENCOUNTER — Ambulatory Visit: Payer: Medicaid Other | Admitting: Psychiatry
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
## 2022-08-06 ENCOUNTER — Encounter: Payer: Self-pay | Admitting: Psychiatry

## 2022-08-06 ENCOUNTER — Telehealth (INDEPENDENT_AMBULATORY_CARE_PROVIDER_SITE_OTHER): Payer: Medicaid Other | Admitting: Psychiatry

## 2022-08-06 DIAGNOSIS — F3341 Major depressive disorder, recurrent, in partial remission: Secondary | ICD-10-CM

## 2022-08-06 DIAGNOSIS — F4310 Post-traumatic stress disorder, unspecified: Secondary | ICD-10-CM | POA: Diagnosis not present

## 2022-08-06 DIAGNOSIS — G4700 Insomnia, unspecified: Secondary | ICD-10-CM | POA: Diagnosis not present

## 2022-08-06 MED ORDER — ARIPIPRAZOLE 15 MG PO TABS: 15.0000 mg | ORAL_TABLET | Freq: Every day | ORAL | 0 refills | Status: DC

## 2022-08-06 MED ORDER — SERTRALINE HCL 100 MG PO TABS: 150.0000 mg | ORAL_TABLET | Freq: Every day | ORAL | 0 refills | Status: DC

## 2022-08-06 NOTE — Patient Instructions (Signed)
Continue sertraline 150 mg daily  Continue Abilify to 15 mg at night  Next appointment: 7/18 at 10 am, in person Referral for evaluation of sleep apnea

## 2022-08-09 ENCOUNTER — Other Ambulatory Visit: Payer: Self-pay | Admitting: Neurology

## 2022-09-10 ENCOUNTER — Ambulatory Visit (INDEPENDENT_AMBULATORY_CARE_PROVIDER_SITE_OTHER): Payer: Medicaid Other | Admitting: Nurse Practitioner

## 2022-09-10 ENCOUNTER — Encounter: Payer: Self-pay | Admitting: Nurse Practitioner

## 2022-09-10 VITALS — BP 100/60 | HR 78 | Temp 97.5°F | Ht 63.0 in | Wt 178.4 lb

## 2022-09-10 DIAGNOSIS — F5101 Primary insomnia: Secondary | ICD-10-CM | POA: Diagnosis not present

## 2022-09-10 DIAGNOSIS — E669 Obesity, unspecified: Secondary | ICD-10-CM

## 2022-09-10 DIAGNOSIS — R0683 Snoring: Secondary | ICD-10-CM | POA: Diagnosis not present

## 2022-09-10 DIAGNOSIS — G4719 Other hypersomnia: Secondary | ICD-10-CM

## 2022-09-10 DIAGNOSIS — G4700 Insomnia, unspecified: Secondary | ICD-10-CM | POA: Insufficient documentation

## 2022-09-10 NOTE — Assessment & Plan Note (Signed)
See above

## 2022-09-10 NOTE — Progress Notes (Signed)
@Patient  ID: Bonnie Stone, female    DOB: [birth year], 47 y.o.   MRN: [id number]  Chief Complaint  Patient presents with   Consult    No previous sleep study. Insomnia and restless sleep for years. Husband says she snores occasionally. Excessive tiredness.    Referring provider: Neysa Hotter, MD  HPI: 47 year old female, active smoker referred for sleep consult.  Past medical history significant for migraines, seizure disorder.   TEST/EVENTS:   09/10/2022: Today-sleep consult Patient presents today for sleep consult, referred by Dr. Vanetta Shawl.  She has a long history of insomnia and restless sleep for many years.  Has trouble falling and staying asleep.  She has excessive daytime tiredness during the day.  Wakes up feeling tired.  Takes two naps a day. She has been told that she has loud snoring when she sleeps at night. Never noticed that she stops breathing when she sleeps at night. She has morning headaches and dry mouth. Some sleep talking. She denies any drowsy driving, sleep walking, sleep paralysis.  Goes to bed between 10 PM and midnight.  Takes her a while to fall asleep.  She has been tried on Zanaflex but does not seem to do much for her.  She does take gabapentin at bedtime as well as lamotrigine for history of seizures.  She typically wakes 4-5 times at night.  Wake time varies depending on how she slept the night before.  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  Her weight is up over 30 pounds over the last 2 years.  Never had a previous sleep study.  Not on oxygen. No history of cardiac disease, stroke or diabetes.  She does have a history of asthma controlled with as needed [place]. She is an active smoker.  Smoking a pack a day.  No excessive alcohol intake.  No excessive caffeine intake.  Lives with her husband.  She is a Futures trader.  Family history of mother with lung and brain cancer.  Epworth 6   Allergies  Allergen Reactions   Bee Venom Anaphylaxis    Penicillins     There is no immunization history for the selected administration types on file for this patient.  Past Medical History:  Diagnosis Date   Seizure (HCC)     Tobacco History: Social History   Tobacco Use  Smoking Status Some Days   Packs/day: 2.00   Years: 23.00   Additional pack years: 0.00   Total pack years: 46.00   Types: Cigarettes  Smokeless Tobacco Never  Tobacco Comments   Smoking since age of 47 years old   0.5 PPD khj 09/10/2022   Ready to quit: Not Answered Counseling given: Not Answered Tobacco comments: Smoking since age of 47 years old 0.5 PPD khj 09/10/2022   Outpatient Medications Prior to Visit  Medication Sig Dispense Refill   ARIPiprazole (ABILIFY) 15 MG tablet Take 1 tablet (15 mg total) by mouth daily. 90 tablet 0   EPINEPHrine 0.3 mg/0.3 mL IJ SOAJ injection Inject 0.3 mLs (0.3 mg total) into the muscle as needed for anaphylaxis. 1 each 1   gabapentin (NEURONTIN) 300 MG capsule Take 1 capsule (300 mg total) by mouth at bedtime. 30 capsule 5   LamoTRIgine 300 MG TB24 24 hour tablet Take 1 tablet (300 mg total) by mouth at bedtime. 90 tablet 3   ondansetron (ZOFRAN ODT) 4 MG disintegrating tablet Take 1 tablet (4 mg total) by mouth every 8 (eight) hours as needed  for nausea or vomiting. 20 tablet 3   rosuvastatin (CRESTOR) 10 MG tablet Take 10 mg by mouth at bedtime.     sertraline (ZOLOFT) 100 MG tablet Take 1.5 tablets (150 mg total) by mouth at bedtime. 135 tablet 0   tiZANidine (ZANAFLEX) 4 MG tablet TAKE 1 TABLET BY MOUTH EVERY 6 HOURS AS NEEDED 30 tablet 6   VENTOLIN HFA 108 (90 Base) MCG/ACT inhaler Inhale 1 puff into the lungs every 8 (eight) hours as needed.     butalbital-acetaminophen-caffeine (FIORICET) 50-325-40 MG tablet TAKE 1 TABLET BY MOUTH EVERY 6 HOURS AS NEEDED FOR HEADACHE (Patient not taking: Reported on 09/10/2022) 12 tablet 3   nicotine (NICODERM CQ - DOSED IN MG/24 HR) 7 mg/24hr patch Place 7 mg onto the skin daily.  (Patient not taking: Reported on 09/10/2022)     Ubrogepant (UBRELVY) 100 MG TABS Take 1 tab at onset of migraine.  May repeat in 2 hrs, if needed.  Max dose: 2 tabs/day. This is a 30 day prescription. (Patient not taking: Reported on 09/10/2022) 15 tablet 11   No facility-administered medications prior to visit.     Review of Systems:   Constitutional: No night sweats, fevers, chills, or lassitude. +weight gain, fatigue  HEENT: No headaches, difficulty swallowing, tooth/dental problems, or sore throat. No sneezing, itching, ear ache, nasal congestion, or post nasal drip CV:  No chest pain, orthopnea, PND, swelling in lower extremities, anasarca, dizziness, palpitations, syncope Resp: +snoring; shortness of breath with exertion. No excess mucus or change in color of mucus. No productive or non-productive. No hemoptysis. No wheezing.  No chest wall deformity GI:  +heartburn, indigestion. No abd pain, N/V, changes in bowel habits  GU: No dysuria, change in color of urine, urgency or frequency.  Skin: No rash, lesions, ulcerations MSK:  No joint pain or swelling.   Neuro: No dizziness or lightheadedness.  Psych: +baseline depression, anxiety (stable). Mood stable. +sleep disturbance    Physical Exam:  BP 100/60 (BP Location: Left Arm, Cuff Size: Normal)   Pulse 78   Temp (!) 97.5 F (36.4 C)   Ht 5\' 3"  (1.6 m)   Wt 178 lb 6.4 oz (80.9 kg)   LMP 03/16/2017 (Approximate)   SpO2 97%   BMI 31.60 kg/m   GEN: Pleasant, interactive, well-appearing; obese; in no acute distress. HEENT:  Normocephalic and atraumatic. PERRLA. Sclera white. Nasal turbinates pink, moist and patent bilaterally. No rhinorrhea present. Oropharynx pink and moist, without exudate or edema. No lesions, ulcerations, or postnasal drip. Mallampati III NECK:  Supple w/ fair ROM. No JVD present. Normal carotid impulses w/o bruits. Thyroid symmetrical with no goiter or nodules palpated. No lymphadenopathy.   CV: RRR, no m/r/g,  no peripheral edema. Pulses intact, +2 bilaterally. No cyanosis, pallor or clubbing. PULMONARY:  Unlabored, regular breathing. Clear bilaterally A&P w/o wheezes/rales/rhonchi. No accessory muscle use.  GI: BS present and normoactive. Soft, non-tender to palpation. No organomegaly or masses detected.  MSK: No erythema, warmth or tenderness. Cap refil <2 sec all extrem. No deformities or joint swelling noted.  Neuro: A/Ox3. No focal deficits noted.   Skin: Warm, no lesions or rashe Psych: Normal affect and behavior. Judgement and thought content appropriate.     Lab Results:  CBC    Component Value Date/Time   WBC 10.0 01/13/2022 1957   RBC 4.63 01/13/2022 1957   HGB 15.0 01/13/2022 1957   HGB 14.5 12/31/2021 0929   HCT 44.1 01/13/2022 1957   HCT 44.5 12/31/2021  0929   PLT 262 01/13/2022 1957   MCV 95.2 01/13/2022 1957   MCV 96 12/31/2021 0929   MCH 32.4 01/13/2022 1957   MCHC 34.0 01/13/2022 1957   RDW 12.9 01/13/2022 1957   RDW 11.8 12/31/2021 0929   LYMPHSABS 3.4 01/13/2022 1957   LYMPHSABS 2.6 12/31/2021 0929   MONOABS 0.7 01/13/2022 1957   EOSABS 0.3 01/13/2022 1957   EOSABS 0.3 12/31/2021 0929   BASOSABS 0.1 01/13/2022 1957   BASOSABS 0.1 12/31/2021 0929    BMET    Component Value Date/Time   NA 141 01/13/2022 1957   NA 141 12/31/2021 0929   K 3.4 (L) 01/13/2022 1957   CL 108 01/13/2022 1957   CO2 21 (L) 01/13/2022 1957   GLUCOSE 100 (H) 01/13/2022 1957   BUN 7 01/13/2022 1957   BUN 12 12/31/2021 0929   CREATININE 0.71 01/13/2022 1957   CALCIUM 9.5 01/13/2022 1957   GFRNONAA >60 01/13/2022 1957   GFRAA >60 04/20/2017 1849    BNP No results found for: "BNP"   Imaging:  No results found.        No data to display          No results found for: "NITRICOXIDE"      Assessment & Plan:   Excessive daytime sleepiness She has snoring, excessive daytime sleepiness, restless sleep. BMI 31. Epworth 6. Given this,  I am concerned she could have  sleep disordered breathing with obstructive sleep apnea. She will need sleep study for further evaluation.    - discussed how weight can impact sleep and risk for sleep disordered breathing - discussed options to assist with weight loss: combination of diet modification, cardiovascular and strength training exercises   - had an extensive discussion regarding the adverse health consequences related to untreated sleep disordered breathing - specifically discussed the risks for hypertension, coronary artery disease, cardiac dysrhythmias, cerebrovascular disease, and diabetes - lifestyle modification discussed   - discussed how sleep disruption can increase risk of accidents, particularly when driving - safe driving practices were discussed  Patient Instructions  Given your symptoms, I am concerned that you may have sleep disordered breathing with sleep apnea. You will need a sleep study for further evaluation. Someone will contact you to schedule this.   We discussed how untreated sleep apnea puts an individual at risk for cardiac arrhthymias, pulm HTN, DM, stroke and increases their risk for daytime accidents. We also briefly reviewed treatment options including weight loss, side sleeping position, oral appliance, CPAP therapy or referral to ENT for possible surgical options  Use caution when driving and pull over if you become sleepy.  Follow up in 6 weeks with Katie Chenae Brager,NP to go over sleep study results, or sooner, if needed     Loud snoring See above  Obesity (BMI 30.0-34.9) BMI 31.6. Healthy weight loss measures.   Insomnia Possibly due to untreated OSA. See above. If no evidence of OSA, could consider alternative pharmacological therapy but would need to be cautious given her current medications and psychiatric history. Sleep hygiene reviewed.    I spent 35 minutes of dedicated to the care of this patient on the date of this encounter to include pre-visit review of records,  face-to-face time with the patient discussing conditions above, post visit ordering of testing, clinical documentation with the electronic health record, making appropriate referrals as documented, and communicating necessary findings to members of the patients care team.  Noemi Chapel, NP 09/10/2022  Pt aware and understands  NP's role.

## 2022-09-10 NOTE — Assessment & Plan Note (Signed)
BMI 31.6. Healthy weight loss measures.

## 2022-09-10 NOTE — Patient Instructions (Signed)
Given your symptoms, I am concerned that you may have sleep disordered breathing with sleep apnea. You will need a sleep study for further evaluation. Someone will contact you to schedule this.   We discussed how untreated sleep apnea puts an individual at risk for cardiac arrhthymias, pulm HTN, DM, stroke and increases their risk for daytime accidents. We also briefly reviewed treatment options including weight loss, side sleeping position, oral appliance, CPAP therapy or referral to ENT for possible surgical options  Use caution when driving and pull over if you become sleepy.  Follow up in 6 weeks with Bonnie Marium Ragan,NP to go over sleep study results, or sooner, if needed   

## 2022-09-10 NOTE — Assessment & Plan Note (Signed)
She has snoring, excessive daytime sleepiness, restless sleep. BMI 31. Epworth 6. Given this,  I am concerned she could have sleep disordered breathing with obstructive sleep apnea. She will need sleep study for further evaluation.    - discussed how weight can impact sleep and risk for sleep disordered breathing - discussed options to assist with weight loss: combination of diet modification, cardiovascular and strength training exercises   - had an extensive discussion regarding the adverse health consequences related to untreated sleep disordered breathing - specifically discussed the risks for hypertension, coronary artery disease, cardiac dysrhythmias, cerebrovascular disease, and diabetes - lifestyle modification discussed   - discussed how sleep disruption can increase risk of accidents, particularly when driving - safe driving practices were discussed  Patient Instructions  Given your symptoms, I am concerned that you may have sleep disordered breathing with sleep apnea. You will need a sleep study for further evaluation. Someone will contact you to schedule this.   We discussed how untreated sleep apnea puts an individual at risk for cardiac arrhthymias, pulm HTN, DM, stroke and increases their risk for daytime accidents. We also briefly reviewed treatment options including weight loss, side sleeping position, oral appliance, CPAP therapy or referral to ENT for possible surgical options  Use caution when driving and pull over if you become sleepy.  Follow up in 6 weeks with Katie Layonna Dobie,NP to go over sleep study results, or sooner, if needed

## 2022-09-10 NOTE — Assessment & Plan Note (Signed)
Possibly due to untreated OSA. See above. If no evidence of OSA, could consider alternative pharmacological therapy but would need to be cautious given her current medications and psychiatric history. Sleep hygiene reviewed.

## 2022-09-13 NOTE — Progress Notes (Signed)
Reviewed and agree with assessment/plan.   Coralyn Helling, MD [facility] Pulmonary/Critical Care 09/13/2022, 7:09 AM Pager:  [phone]

## 2022-10-12 ENCOUNTER — Ambulatory Visit: Payer: Medicaid Other | Admitting: Neurology

## 2022-10-12 ENCOUNTER — Encounter: Payer: Self-pay | Admitting: Neurology

## 2022-10-12 VITALS — BP 120/78 | HR 89 | Ht 63.0 in | Wt 176.4 lb

## 2022-10-12 DIAGNOSIS — G43709 Chronic migraine without aura, not intractable, without status migrainosus: Secondary | ICD-10-CM | POA: Diagnosis not present

## 2022-10-12 DIAGNOSIS — R410 Disorientation, unspecified: Secondary | ICD-10-CM

## 2022-10-12 DIAGNOSIS — G40909 Epilepsy, unspecified, not intractable, without status epilepticus: Secondary | ICD-10-CM | POA: Diagnosis not present

## 2022-10-12 MED ORDER — LAMOTRIGINE ER 300 MG PO TB24: 300.0000 mg | ORAL_TABLET | Freq: Every evening | ORAL | 3 refills | Status: DC

## 2022-10-12 NOTE — Progress Notes (Signed)
Chief Complaint  Patient presents with   Follow-up    Rm14, alone, SZ: cannot remember when last one was and has no concerns to report regarding this topic,  MIGRAINE 2 in past 60 day, no problems to report       ASSESSMENT AND PLAN  Bonnie Stone is a 47 y.o. 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  EEG 01/06/2022 CONCLUSION: This is a  normal awake EEG.  There is no electrodiagnostic evidence of epileptiform discharge.   MRI of the brain without contrast July 13 2020:: 1. No acute intracranial abnormality. 2. Multifocal hyperintense T2-weighted signal within the brainstem, which may be a sequela of chronic small vessel ischemia.  CT HEAD on July 16, 2020   Normal head CT.  No acute intracranial abnormality.   CTA HEAD AND NECK IMPRESSION:   Normal CTA of the head and neck. No large vessel occlusion, hemodynamically significant stenosis, or other acute vascular abnormality. No aneurysm.   CT VENOGRAM IMPRESSION:   Normal CT venogram.  No evidence for dural sinus thrombosis.   Laboratory evaluation April 2022, CMP, normal creatinine 0.73, albumin 3.4, total protein 6.0, normal CBC, B12, magnesium, alcohol less than 10, normal TSH, A1c 5.5, negative HIV, normal CPK 67,  negative RPR, cocaine was + July 17, 2020, that was confirmed, serum drug screen and urine drug screen was also positive for marijuana THC, negative hepatitis C,  CSF on July 14, 2020, negative for HSV type I/II,, varicella-zoster PCR, negative culture, WBC 0, RBC 1, protein 27, glucose 59,    Video-EEG monitoring April 13-14 2022. This  study is suggestive of mild to moderate diffuse encephalopathy, nonspecific etiology. The excessive beta activity seen in the background is most likely due to the effect of medications like benzodiazepine and is a benign EEG pattern. No seizures or epileptiform discharges were seen throughout the recording.   Multiple events were captured as described above without concomitant eeg change and were NOT epileptic.    HISTORICAL: Bonnie Stone is a 47 year old female, seen in request by her primary care physician Dr.   Janit Pagan T for evaluation of seizure, initial evaluation was with her husband on Aug 12, 2020  I reviewed and summarized the referring note.  Past medical history Illicit drug use, UDS was positive for marijuana and cocaine, Smoking Mood disorder, but was never treated,  On July 13, 2020, she was grocery shopping with her 69 year old daughter, on her way to check out, she felt dizzy, was able to manage to the parking lot, then lost consciousness, daughter reported weakness generalized tonic-clonic activity, she woke up with paramedics around her, postevent confusion  She was admitted to the hospital, had extensive evaluations, MRI of the brain showed multifocal small vessel disease, mainly involving brainstem, no acute abnormality, CT angiogram of head and neck, CT venogram of the brain showed no significant abnormality  UDS was positive for marijuana  She was discharged to home on July 16, 2020, husband reported she was noted to be very weak, needs to be supported back to home, the same day of discharge few hours later, she  was noted to have recurrent seizure-like spells, patient was able to prescribe she often has the aura of smelling ammonia before the onset of body jerking movement,  She also had spinal tap July 14, 2020, there was no significant abnormality found, but the second UDS on April 14 was positive for both marijuana and cocaine,   UPDATE 01/08/2021 Dr.  Terrace Arabia: She is with her daughter at home, confirmed the medication lamotrigine ER 200 mg every night, Zoloft 50 mg daily, reported transient loss of consciousness, that was witnessed by daughter, patient has no recollection of the event,  But today her main concern is 3 days history of severe right side headache, she did have a history of migraine in the past, also has mild headaches, take over-the-counter medications, has not had severe headache like this for many months, she complains of light noise sensitivity, nauseous, failed multiple home medications,  Reviewed MRI of the brain in April 2022, multifocal small vessel disease, not a good candidate for triptan treatment,  UPDATE Sept 28 2023  She is accompanied by her husband at today's clinical visit, complains of increased headache stress recently, there was increased frequency of transient confusion spells, has been able to see it, glazed look in her eyes, lasting for few minutes, followed by extreme confusion, patient often smelled burnt popcorn or some weird smells prior to symptom onset, it can happen up to twice a week   UPDATE October 12 2022: She is doing well taking current Lamotrigine ER 300mg  at bedtime, also on polypharmacy for bipolar, depression, anxiety, PTSD, Abilify 15mg  qam, zoloft 100mg  at bedtime, gabapentin 300mg  at bedtime,  1 seizure-like spell was a month ago, she smells cat urine,  had a headache with it, went to sleep, woke up not feeling much better  She rarely take Ubrelvy, does help her headache,   REVIEW OF SYSTEMS:  Full 14 system review of systems performed and notable only for as above All other review of systems were negative.  PHYSICAL EXAM:  Today's Vitals   10/12/22 1121  BP: 120/78  Pulse: 89  Weight: 176 lb 6.4 oz (80 kg)  Height: 5\' 3"  (1.6 m)    Body mass index is 31.25 kg/m.   PHYSICAL EXAMNIATION:  Gen: NAD, conversant, well nourised, well groomed                     Cardiovascular: Regular  rate rhythm, no peripheral edema, warm, nontender. Eyes: Conjunctivae clear without exudates or hemorrhage Neck: Supple, no carotid bruits. Pulmonary: Clear to auscultation bilaterally   NEUROLOGICAL EXAM:  MENTAL STATUS: Speech/cognition: Depressed looking middle-age female, alert oriented to history taking and casual conversation  CRANIAL NERVES: CN II: Visual fields are full to confrontation.  Pupils are round equal and briskly reactive to light. CN III, IV, VI: extraocular movement are normal. No ptosis. CN V: Facial sensation is intact to pinprick in all 3 divisions bilaterally. Corneal responses are intact.  CN VII: Face is symmetric with normal eye closure and smile. CN VIII: Hearing is normal to casual conversation CN IX, X: Palate elevates symmetrically. Phonation is normal. CN XI: Head turning and shoulder shrug are intact   MOTOR: There is no pronator drift of out-stretched arms. Muscle bulk and tone are normal. Muscle strength is normal.  REFLEXES: Reflexes are 2+ and symmetric at the biceps, triceps, knees, and ankles. Plantar responses are flexor.  SENSORY: Intact to light touch, pinprick, positional and vibratory sensation are intact in fingers and  toes.  COORDINATION: There is no dysmetria on finger-to-nose and heel-knee-shin.    GAIT/STANCE: Posture is normal. Gait is steady    Allergy: Allergies  Allergen Reactions   Bee Venom Anaphylaxis   Penicillins     HOME MEDICATIONS: Current Outpatient Medications  Medication Sig Dispense Refill   ARIPiprazole (ABILIFY) 15 MG tablet Take 1 tablet (15 mg total) by mouth daily. 90 tablet 0   butalbital-acetaminophen-caffeine (FIORICET) 50-325-40 MG tablet TAKE 1 TABLET BY MOUTH EVERY 6 HOURS AS NEEDED FOR HEADACHE 12 tablet 3   EPINEPHrine 0.3 mg/0.3 mL IJ SOAJ injection Inject 0.3 mLs (0.3 mg total) into the muscle as needed for anaphylaxis. 1 each 1   gabapentin (NEURONTIN) 300 MG capsule Take 1 capsule (300 mg  total) by mouth at bedtime. 30 capsule 5   LamoTRIgine 300 MG TB24 24 hour tablet Take 1 tablet (300 mg total) by mouth at bedtime. 90 tablet 3   nicotine (NICODERM CQ - DOSED IN MG/24 HR) 7 mg/24hr patch Place 7 mg onto the skin daily.     ondansetron (ZOFRAN ODT) 4 MG disintegrating tablet Take 1 tablet (4 mg total) by mouth every 8 (eight) hours as needed for nausea or vomiting. 20 tablet 3   rosuvastatin (CRESTOR) 10 MG tablet Take 10 mg by mouth at bedtime.     sertraline (ZOLOFT) 100 MG tablet Take 1.5 tablets (150 mg total) by mouth at bedtime. 135 tablet 0   Ubrogepant (UBRELVY) 100 MG TABS Take 1 tab at onset of migraine.  May repeat in 2 hrs, if needed.  Max dose: 2 tabs/day. This is a 30 day prescription. 15 tablet 11   VENTOLIN HFA 108 (90 Base) MCG/ACT inhaler Inhale 1 puff into the lungs every 8 (eight) hours as needed.     No current facility-administered medications for this visit.    PAST MEDICAL HISTORY: Past Medical History:  Diagnosis Date   Seizure (HCC)     PAST SURGICAL HISTORY: Past Surgical History:  Procedure Laterality Date   CHOLECYSTECTOMY     TUBAL LIGATION      FAMILY HISTORY: Family History  Problem Relation Age of Onset   Seizures Son        Epilepsy   Seizures Son        Febrile seizures   Lung cancer Mother    Other Father        unknown    SOCIAL HISTORY: Social History   Socioeconomic History   Marital status: Married    Spouse name: Not on file   Number of children: 4   Years of education: 12   Highest education level: High school graduate  Occupational History   Occupation: homemaker  Tobacco Use   Smoking status: Some Days    Packs/day: 2.00    Years: 23.00    Additional pack years: 0.00    Total pack years: 46.00    Types: Cigarettes   Smokeless tobacco: Never   Tobacco comments:    Smoking since age of 47 years old    0.5 PPD khj 09/10/2022  Vaping Use   Vaping Use: Never used  Substance and Sexual Activity    Alcohol use: Not Currently    Comment: Drinks once a month with husband   Drug use: Not Currently    Types: Cocaine    Comment: Reports last use 2008 after conception of last child   Sexual activity: Yes    Partners: Male    Birth control/protection: None  Other Topics Concern   Not on file  Social History Narrative   Lives at home with her family.   3-4 cups caffeine per day.   Right-handed.   Social Determinants of Health   Financial Resource Strain: Not on file  Food Insecurity: Not on file  Transportation Needs: Not on file  Physical Activity: Not on file  Stress: Not on file  Social Connections: Not on file  Intimate Partner Violence: Not on file

## 2022-10-13 LAB — COMPREHENSIVE METABOLIC PANEL
ALT: 12 IU/L (ref 0–32)
AST: 16 IU/L (ref 0–40)
Albumin: 4.3 g/dL (ref 3.9–4.9)
Alkaline Phosphatase: 109 IU/L (ref 44–121)
BUN/Creatinine Ratio: 10 (ref 9–23)
BUN: 7 mg/dL (ref 6–24)
Bilirubin Total: <0.2 mg/dL (ref 0.0–1.2)
CO2: 21 mmol/L (ref 20–29)
Calcium: 9.3 mg/dL (ref 8.7–10.2)
Chloride: 105 mmol/L (ref 96–106)
Creatinine, Ser: 0.7 mg/dL (ref 0.57–1.00)
Globulin, Total: 2.2 g/dL (ref 1.5–4.5)
Glucose: 101 mg/dL — ABNORMAL HIGH (ref 70–99)
Potassium: 3.6 mmol/L (ref 3.5–5.2)
Sodium: 144 mmol/L (ref 134–144)
Total Protein: 6.5 g/dL (ref 6.0–8.5)
eGFR: 107 mL/min/{1.73_m2} (ref >59)

## 2022-10-13 LAB — CBC WITH DIFFERENTIAL
Basophils Absolute: 0.1 10*3/uL (ref 0.0–0.2)
Basos: 1 %
EOS (ABSOLUTE): 0.3 10*3/uL (ref 0.0–0.4)
Eos: 3 %
Hematocrit: 43.9 % (ref 34.0–46.6)
Hemoglobin: 15.1 g/dL (ref 11.1–15.9)
Immature Grans (Abs): 0 10*3/uL (ref 0.0–0.1)
Immature Granulocytes: 0 %
Lymphocytes Absolute: 3.5 10*3/uL — ABNORMAL HIGH (ref 0.7–3.1)
Lymphs: 32 %
MCH: 32.2 pg (ref 26.6–33.0)
MCHC: 34.4 g/dL (ref 31.5–35.7)
MCV: 94 fL (ref 79–97)
Monocytes Absolute: 0.8 10*3/uL (ref 0.1–0.9)
Monocytes: 7 %
Neutrophils Absolute: 6.4 10*3/uL (ref 1.4–7.0)
Neutrophils: 57 %
RBC: 4.69 x10E6/uL (ref 3.77–5.28)
RDW: 12.6 % (ref 11.7–15.4)
WBC: 11.2 10*3/uL — ABNORMAL HIGH (ref 3.4–10.8)

## 2022-10-13 LAB — TSH: TSH: 1.1 u[IU]/mL (ref 0.450–4.500)

## 2022-10-20 ENCOUNTER — Telehealth: Payer: Self-pay | Admitting: Nurse Practitioner

## 2022-10-20 NOTE — Telephone Encounter (Signed)
I have spoke with Mrs. Warshaw and she has been scheduled to pick up the HST machine 10/21/22 @ 3:00pm

## 2022-10-21 ENCOUNTER — Ambulatory Visit: Payer: Medicaid Other

## 2022-10-21 ENCOUNTER — Ambulatory Visit: Payer: Medicaid Other | Admitting: Psychiatry

## 2022-10-21 DIAGNOSIS — G4733 Obstructive sleep apnea (adult) (pediatric): Secondary | ICD-10-CM

## 2022-10-21 DIAGNOSIS — R0683 Snoring: Secondary | ICD-10-CM

## 2022-10-21 DIAGNOSIS — G4719 Other hypersomnia: Secondary | ICD-10-CM

## 2022-10-27 DIAGNOSIS — G4733 Obstructive sleep apnea (adult) (pediatric): Secondary | ICD-10-CM
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
## 2022-10-31 IMAGING — CT CT VENOGRAM HEAD
1 of 11 series · 3 of 33 positions shown · IV contrast (APPLIED)
Comparison: Prior CT and MRI from 07/13/2020.

CLINICAL DATA: Initial evaluation for acute neuro deficit, stroke
suspected, seizure.

EXAM:
CT ANGIOGRAPHY HEAD AND NECK
CT VENOGRAM HEAD
TECHNIQUE: Multidetector CT imaging of the head and neck was performed using
the standard protocol during bolus administration of intravenous
contrast. Multiplanar CT image reconstructions and MIPs were
obtained to evaluate the vascular anatomy. Carotid stenosis
measurements (when applicable) are obtained utilizing NASCET
criteria, using the distal internal carotid diameter as the
denominator.
CONTRAST:  75mL OMNIPAQUE IOHEXOL 350 MG/ML SOLN

[Series 6: venogram · axial · 0.41mm/px · z∈[+1042,+1194]mm · 3 of 77 slices shown]
[im 1/77  soft-tissue]
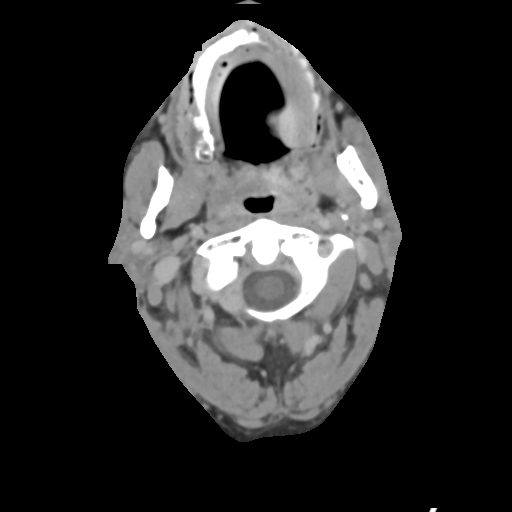
[im 39/77  bone]
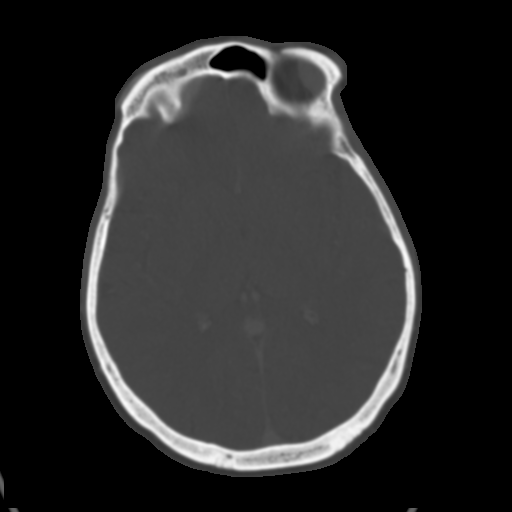
[im 77/77  soft-tissue]
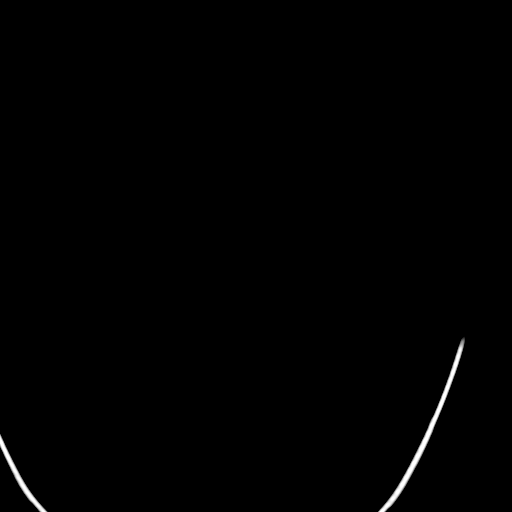

[3 of 33 positions shown; findings below may reference images not displayed]

FINDINGS: CT HEAD FINDINGS

Brain: Cerebral volume within normal limits for patient age.

No evidence for acute intracranial hemorrhage. No findings to
suggest acute large vessel territory infarct. No mass lesion,
midline shift, or mass effect. Ventricles are normal in size without
evidence for hydrocephalus. No extra-axial fluid collection
identified.

Vascular: No hyperdense vessel identified.

Skull: Scalp soft tissues demonstrate no acute abnormality.
Calvarium intact.

Sinuses/Orbits: Globes and orbital soft tissues within normal
limits.

Mild scattered mucosal thickening noted within the ethmoidal air
cells. Paranasal sinuses are otherwise clear. No mastoid effusion.

CTA NECK FINDINGS

Aortic arch: Visualized aortic arch normal caliber with normal 3
vessel morphology. No hemodynamically significant stenosis seen
about the origin of the great vessels. Visualized subclavian
arteries widely patent.

Right carotid system: Right common and internal carotid arteries
widely patent without stenosis, dissection or occlusion.

Left carotid system: Left common and internal carotid arteries
widely patent without stenosis, dissection or occlusion.

Vertebral arteries: Both vertebral arteries arise from the
subclavian arteries. No proximal subclavian artery stenosis. Both
vertebral arteries widely patent without stenosis, dissection or
occlusion.

Skeleton: No acute osseous abnormality. No discrete or worrisome
osseous lesions.

Other neck: No other acute soft tissue abnormality within the neck.
No mass or adenopathy.

Upper chest: No other acute abnormality within the visualized upper
chest. Centrilobular emphysema noted.

Review of the MIP images confirms the above findings

CTA HEAD FINDINGS

Anterior circulation: Both internal carotid arteries widely patent
to the termini without stenosis. A1 segments widely patent. Normal
anterior communicating artery complex. Both anterior cerebral
arteries widely patent to their distal aspects without stenosis. No
M1 stenosis or occlusion. Normal MCA bifurcations. Distal MCA
branches well perfused and symmetric.

Posterior circulation: Both V4 segments patent to the
vertebrobasilar junction without stenosis. Both PICA origins patent
and normal. Basilar widely patent to its distal aspect without
stenosis. Superior cerebellar arteries patent bilaterally. Both PCAs
primarily supplied via the basilar and are well perfused to there
distal aspects.

Venous sinuses: Normal enhancement seen throughout the superior
sagittal sinus to the level of the torcula. Torcula itself is
patent. Transverse and sigmoid sinuses appear patent as do the
proximal internal jugular veins. Straight sinus, vein of [place]idah,
internal cerebral veins, and basal veins of Omarine appear patent.
No evidence for dural sinus thrombosis.

Anatomic variants: None significant.  No intracranial aneurysm.

Review of the MIP images confirms the above findings
IMPRESSION: CT HEAD IMPRESSION:

Normal head CT.  No acute intracranial abnormality.

CTA HEAD AND NECK IMPRESSION:

Normal CTA of the head and neck. No large vessel occlusion,
hemodynamically significant stenosis, or other acute vascular
abnormality. No aneurysm.

CT VENOGRAM IMPRESSION:

Normal CT venogram.  No evidence for dural sinus thrombosis.

## 2022-11-01 ENCOUNTER — Other Ambulatory Visit: Payer: Self-pay | Admitting: Psychiatry

## 2022-11-02 ENCOUNTER — Ambulatory Visit (INDEPENDENT_AMBULATORY_CARE_PROVIDER_SITE_OTHER): Payer: Medicaid Other | Admitting: Psychiatry

## 2022-11-02 ENCOUNTER — Encounter: Payer: Self-pay | Admitting: Psychiatry

## 2022-11-02 VITALS — BP 122/78 | HR 90 | Temp 98.2°F | Ht 63.0 in | Wt 175.0 lb

## 2022-11-02 DIAGNOSIS — F3341 Major depressive disorder, recurrent, in partial remission: Secondary | ICD-10-CM | POA: Diagnosis not present

## 2022-11-02 DIAGNOSIS — F4310 Post-traumatic stress disorder, unspecified: Secondary | ICD-10-CM

## 2022-11-02 MED ORDER — ARIPIPRAZOLE 15 MG PO TABS: 15.0000 mg | ORAL_TABLET | Freq: Every day | ORAL | 0 refills | Status: DC

## 2022-11-02 MED ORDER — SERTRALINE HCL 100 MG PO TABS: 150.0000 mg | ORAL_TABLET | Freq: Every day | ORAL | 0 refills | Status: DC

## 2022-11-02 NOTE — Patient Instructions (Signed)
Continue sertraline 150 mg daily  Continue Abilify 15 mg at night  Next appointment: 9/17 at 10 am

## 2022-11-03 ENCOUNTER — Ambulatory Visit: Payer: Medicaid Other | Admitting: Nurse Practitioner

## 2022-11-08 ENCOUNTER — Telehealth: Payer: Self-pay

## 2022-11-08 NOTE — Telephone Encounter (Signed)
Prior Authorization  submitted online with CoverMyMeds and sent to the patients insurance [employer] [place]  for approval of Aripiprazole 15 mg tablets #90 for 90 days.  Prior Authorization approved PA case #[id number] Coverage 11/08/22--11/08/23 Patient and pharmacy made aware

## 2022-12-06 ENCOUNTER — Other Ambulatory Visit: Payer: Self-pay | Admitting: Adult Health
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
## 2022-12-21 ENCOUNTER — Encounter: Payer: Self-pay | Admitting: Psychiatry

## 2022-12-21 ENCOUNTER — Ambulatory Visit (INDEPENDENT_AMBULATORY_CARE_PROVIDER_SITE_OTHER): Payer: Medicaid Other | Admitting: Psychiatry

## 2022-12-21 VITALS — BP 120/86 | HR 81 | Temp 97.3°F | Ht 63.0 in | Wt 168.4 lb

## 2022-12-21 DIAGNOSIS — F331 Major depressive disorder, recurrent, moderate: Secondary | ICD-10-CM

## 2022-12-21 DIAGNOSIS — F4310 Post-traumatic stress disorder, unspecified: Secondary | ICD-10-CM | POA: Diagnosis not present

## 2022-12-21 MED ORDER — SERTRALINE HCL 100 MG PO TABS: 150.0000 mg | ORAL_TABLET | Freq: Every day | ORAL | 0 refills | Status: DC

## 2022-12-21 MED ORDER — ARIPIPRAZOLE 15 MG PO TABS: 15.0000 mg | ORAL_TABLET | Freq: Every day | ORAL | 1 refills | Status: DC

## 2022-12-21 NOTE — Patient Instructions (Signed)
Continue sertraline 150 mg daily  Continue Abilify 15 mg at night  Next appointment: 11/5 at 3 pm

## 2022-12-23 ENCOUNTER — Other Ambulatory Visit: Payer: Self-pay | Admitting: Neurology

## 2023-01-05 ENCOUNTER — Telehealth: Payer: Self-pay | Admitting: Neurology

## 2023-01-05 DIAGNOSIS — G40909 Epilepsy, unspecified, not intractable, without status epilepticus: Secondary | ICD-10-CM

## 2023-01-05 NOTE — Telephone Encounter (Signed)
Orders Placed This Encounter  Procedures   Lamotrigine level   EEG adult   Please patient know, I have ordered a blood test,and eeg.  She may have blood test done at office without appt.   Give her a follow up with NP in 2-3 months
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
## 2023-01-05 NOTE — Telephone Encounter (Signed)
Called and spoke with patient about orders and blood work, she reports she will come in soon for blood work.   Please call and schedule with NP as Dr. Terrace Arabia advised as well as for EEG. Thanks.

## 2023-01-05 NOTE — Telephone Encounter (Signed)
Pt is asking for a call to discuss a headache she has had all week and the seizure she had on yesterday, please call.  Pt confirmed she did not go to ED for seizure.

## 2023-01-05 NOTE — Addendum Note (Signed)
Addended by: Levert Feinstein on: 01/05/2023 02:02 PM   Modules accepted: Orders

## 2023-01-12 ENCOUNTER — Ambulatory Visit: Payer: Medicaid Other | Admitting: Nurse Practitioner

## 2023-01-12 ENCOUNTER — Encounter: Payer: Self-pay | Admitting: Nurse Practitioner

## 2023-01-12 VITALS — BP 110/86 | HR 81 | Temp 98.0°F | Ht 63.0 in | Wt 167.0 lb

## 2023-01-12 DIAGNOSIS — G4733 Obstructive sleep apnea (adult) (pediatric): Secondary | ICD-10-CM | POA: Diagnosis not present

## 2023-01-12 NOTE — Progress Notes (Unsigned)
@Patient  ID: Bonnie Stone, female    DOB: 12-May-1975, 47 y.o.   MRN: [id number]  Chief Complaint  Patient presents with   Follow-up    Home sleep test done on 10/25/22. Patient reports cough, shortness of breath and wheezing, at night.     Referring provider: Hamrick, Durward Fortes, MD  HPI: 47 year old female, active smoker referred for sleep consult.  Past medical history significant for migraines, seizure disorder.   TEST/EVENTS:   09/10/2022: Today-sleep consult Patient presents today for sleep consult, referred by Dr. Vanetta Shawl.  She has a long history of insomnia and restless sleep for many years.  Has trouble falling and staying asleep.  She has excessive daytime tiredness during the day.  Wakes up feeling tired.  Takes two naps a day. She has been told that she has loud snoring when she sleeps at night. Never noticed that she stops breathing when she sleeps at night. She has morning headaches and dry mouth. Some sleep talking. She denies any drowsy driving, sleep walking, sleep paralysis.  Goes to bed between 10 PM and midnight.  Takes her a while to fall asleep.  She has been tried on Zanaflex but does not seem to do much for her.  She does take gabapentin at bedtime as well as lamotrigine for history of seizures.  She typically wakes 4-5 times at night.  Wake time varies depending on how she slept the night before.  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  Her weight is up over 30 pounds over the last 2 years.  Never had a previous sleep study.  Not on oxygen. No history of cardiac disease, stroke or diabetes.  She does have a history of asthma controlled with as needed [place]. She is an active smoker.  Smoking a pack a day.  No excessive alcohol intake.  No excessive caffeine intake.  Lives with her husband.  She is a Futures trader.  Family history of mother with lung and brain cancer.  Epworth 6   Allergies  Allergen Reactions   Bee Venom Anaphylaxis   Penicillins      There is no immunization history for the selected administration types on file for this patient.  Past Medical History:  Diagnosis Date   Seizure (HCC)     Tobacco History: Social History   Tobacco Use  Smoking Status Some Days   Current packs/day: 2.00   Average packs/day: 2.0 packs/day for 23.0 years (46.0 ttl pk-yrs)   Types: Cigarettes  Smokeless Tobacco Never  Tobacco Comments   Smoking since age of 47 years old   0.5 PPD khj 09/10/2022   Ready to quit: Not Answered Counseling given: Not Answered Tobacco comments: Smoking since age of 47 years old 0.5 PPD khj 09/10/2022   Outpatient Medications Prior to Visit  Medication Sig Dispense Refill   [START ON 01/31/2023] ARIPiprazole (ABILIFY) 15 MG tablet Take 1 tablet (15 mg total) by mouth daily. 90 tablet 1   butalbital-acetaminophen-caffeine (FIORICET) 50-325-40 MG tablet TAKE 1 TABLET BY MOUTH EVERY 6 HOURS AS NEEDED FOR HEADACHE 12 tablet 3   EPINEPHrine 0.3 mg/0.3 mL IJ SOAJ injection Inject 0.3 mLs (0.3 mg total) into the muscle as needed for anaphylaxis. 1 each 1   gabapentin (NEURONTIN) 300 MG capsule TAKE 1 CAPSULE BY MOUTH EVERYDAY AT BEDTIME 30 capsule 5   LamoTRIgine 300 MG TB24 24 hour tablet Take 1 tablet (300 mg total) by mouth at bedtime. 90 tablet 3  ondansetron (ZOFRAN ODT) 4 MG disintegrating tablet Take 1 tablet (4 mg total) by mouth every 8 (eight) hours as needed for nausea or vomiting. 20 tablet 3   rosuvastatin (CRESTOR) 10 MG tablet Take 10 mg by mouth at bedtime.     [START ON 01/31/2023] sertraline (ZOLOFT) 100 MG tablet Take 1.5 tablets (150 mg total) by mouth at bedtime. 135 tablet 0   Ubrogepant (UBRELVY) 100 MG TABS Take 1 tab at onset of migraine.  May repeat in 2 hrs, if needed.  Max dose: 2 tabs/day. This is a 30 day prescription. 15 tablet 11   VENTOLIN HFA 108 (90 Base) MCG/ACT inhaler Inhale 1 puff into the lungs every 8 (eight) hours as needed.     No facility-administered medications  prior to visit.     Review of Systems:   Constitutional: No night sweats, fevers, chills, or lassitude. +weight gain, fatigue  HEENT: No headaches, difficulty swallowing, tooth/dental problems, or sore throat. No sneezing, itching, ear ache, nasal congestion, or post nasal drip CV:  No chest pain, orthopnea, PND, swelling in lower extremities, anasarca, dizziness, palpitations, syncope Resp: +snoring; shortness of breath with exertion. No excess mucus or change in color of mucus. No productive or non-productive. No hemoptysis. No wheezing.  No chest wall deformity GI:  +heartburn, indigestion. No abd pain, N/V, changes in bowel habits  GU: No dysuria, change in color of urine, urgency or frequency.  Skin: No rash, lesions, ulcerations MSK:  No joint pain or swelling.   Neuro: No dizziness or lightheadedness.  Psych: +baseline depression, anxiety (stable). Mood stable. +sleep disturbance    Physical Exam:  BP 110/86 (BP Location: Left Arm, Patient Position: Sitting, Cuff Size: Normal)   Pulse 81   Temp 98 F (36.7 C) (Temporal)   Ht 5\' 3"  (1.6 m)   Wt 167 lb (75.8 kg)   LMP 03/16/2017 (Approximate)   SpO2 97%   BMI 29.58 kg/m   GEN: Pleasant, interactive, well-appearing; obese; in no acute distress. HEENT:  Normocephalic and atraumatic. PERRLA. Sclera white. Nasal turbinates pink, moist and patent bilaterally. No rhinorrhea present. Oropharynx pink and moist, without exudate or edema. No lesions, ulcerations, or postnasal drip. Mallampati III NECK:  Supple w/ fair ROM. No JVD present. Normal carotid impulses w/o bruits. Thyroid symmetrical with no goiter or nodules palpated. No lymphadenopathy.   CV: RRR, no m/r/g, no peripheral edema. Pulses intact, +2 bilaterally. No cyanosis, pallor or clubbing. PULMONARY:  Unlabored, regular breathing. Clear bilaterally A&P w/o wheezes/rales/rhonchi. No accessory muscle use.  GI: BS present and normoactive. Soft, non-tender to palpation. No  organomegaly or masses detected.  MSK: No erythema, warmth or tenderness. Cap refil <2 sec all extrem. No deformities or joint swelling noted.  Neuro: A/Ox3. No focal deficits noted.   Skin: Warm, no lesions or rashe Psych: Normal affect and behavior. Judgement and thought content appropriate.     Lab Results:  CBC    Component Value Date/Time   WBC 11.2 (H) 10/12/2022 1142   WBC 10.0 01/13/2022 1957   RBC 4.69 10/12/2022 1142   RBC 4.63 01/13/2022 1957   HGB 15.1 10/12/2022 1142   HCT 43.9 10/12/2022 1142   PLT 262 01/13/2022 1957   MCV 94 10/12/2022 1142   MCH 32.2 10/12/2022 1142   MCH 32.4 01/13/2022 1957   MCHC 34.4 10/12/2022 1142   MCHC 34.0 01/13/2022 1957   RDW 12.6 10/12/2022 1142   LYMPHSABS 3.5 (H) 10/12/2022 1142   MONOABS 0.7 01/13/2022 1957  EOSABS 0.3 10/12/2022 1142   BASOSABS 0.1 10/12/2022 1142    BMET    Component Value Date/Time   NA 144 10/12/2022 1142   K 3.6 10/12/2022 1142   CL 105 10/12/2022 1142   CO2 21 10/12/2022 1142   GLUCOSE 101 (H) 10/12/2022 1142   GLUCOSE 100 (H) 01/13/2022 1957   BUN 7 10/12/2022 1142   CREATININE 0.70 10/12/2022 1142   CALCIUM 9.3 10/12/2022 1142   GFRNONAA >60 01/13/2022 1957   GFRAA >60 04/20/2017 1849    BNP No results found for: "BNP"   Imaging:  No results found.  Administration History     None           No data to display          No results found for: "NITRICOXIDE"      Assessment & Plan:   No problem-specific Assessment & Plan notes found for this encounter.    I spent 35 minutes of dedicated to the care of this patient on the date of this encounter to include pre-visit review of records, face-to-face time with the patient discussing conditions above, post visit ordering of testing, clinical documentation with the electronic health record, making appropriate referrals as documented, and communicating necessary findings to members of the patients care team.  Noemi Chapel, NP 01/12/2023  Pt aware and understands NP's role.

## 2023-01-12 NOTE — Patient Instructions (Signed)
Your sleep study showed mild sleep apnea. We discussed how untreated sleep apnea puts an individual at risk for cardiac arrhthymias, pulm HTN, DM, stroke and increases their risk for daytime accidents; although, these are minimal with mild severity sleep apnea. We also briefly reviewed treatment options including weight loss, side sleeping position, oral appliance, CPAP therapy  You have opted for positional sleeping with side sleeping position. Let us know if you have any worsening of your symptoms or change your mind  Follow up with [place] as needed

## 2023-01-13 ENCOUNTER — Encounter: Payer: Self-pay | Admitting: Nurse Practitioner

## 2023-01-13 DIAGNOSIS — G4733 Obstructive sleep apnea (adult) (pediatric): Secondary | ICD-10-CM | POA: Insufficient documentation

## 2023-01-13 NOTE — Assessment & Plan Note (Signed)
Mild OSA with AHI 7.2/h.  She does have symptoms of insomnia and associated daytime sleepiness.  Discussed potential benefits of therapy with CPAP or oral appliance.  At this point in time, she does not want to do either of these.  She would rather work on sleep hygiene measures and utilize positional sleeping.  Reviewed risks of untreated mild sleep apnea.  Aware of safe driving practices.  She will call if symptoms do not improve or worsen.  Encouraged her to continue working with her psychiatrist on managing her medications and sleep.   Patient Instructions  Your sleep study showed mild sleep apnea. We discussed how untreated sleep apnea puts an individual at risk for cardiac arrhthymias, pulm HTN, DM, stroke and increases their risk for daytime accidents; although, these are minimal with mild severity sleep apnea. We also briefly reviewed treatment options including weight loss, side sleeping position, oral appliance, CPAP therapy  You have opted for positional sleeping with side sleeping position. Let us know if you have any worsening of your symptoms or change your mind  Follow up with [place] as needed
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
## 2023-02-08 ENCOUNTER — Ambulatory Visit (INDEPENDENT_AMBULATORY_CARE_PROVIDER_SITE_OTHER): Payer: Medicaid Other | Admitting: Psychiatry

## 2023-02-08 ENCOUNTER — Encounter: Payer: Self-pay | Admitting: Psychiatry

## 2023-02-08 VITALS — BP 118/83 | HR 84 | Temp 97.4°F | Ht 63.0 in | Wt 167.2 lb

## 2023-02-08 DIAGNOSIS — F4310 Post-traumatic stress disorder, unspecified: Secondary | ICD-10-CM

## 2023-02-08 DIAGNOSIS — F331 Major depressive disorder, recurrent, moderate: Secondary | ICD-10-CM | POA: Diagnosis not present

## 2023-02-09 ENCOUNTER — Other Ambulatory Visit: Payer: Medicaid Other | Admitting: *Deleted

## 2023-02-09 ENCOUNTER — Encounter: Payer: Self-pay | Admitting: *Deleted

## 2023-02-10 ENCOUNTER — Telehealth: Payer: Self-pay | Admitting: Adult Health

## 2023-02-10 NOTE — Telephone Encounter (Signed)
Spoke with pt, pt's appointment with Shanda Bumps has been rescheduled to the first available opening which is 08/04/23 at 7:45am and appointment has been added to the wait list

## 2023-02-10 NOTE — Telephone Encounter (Signed)
Patient missed her EEG appointment yesterday 02/09/2023 she called in and r/s for 03/17/23 but that is also the same day she is suppose to see Ihor Austin, NP. I was not able to find anything soon after her EEG.

## 2023-03-17 ENCOUNTER — Ambulatory Visit: Payer: Medicaid Other | Admitting: Adult Health

## 2023-03-17 ENCOUNTER — Ambulatory Visit (INDEPENDENT_AMBULATORY_CARE_PROVIDER_SITE_OTHER): Payer: Medicaid Other | Admitting: Neurology

## 2023-03-17 DIAGNOSIS — G40909 Epilepsy, unspecified, not intractable, without status epilepticus: Secondary | ICD-10-CM | POA: Diagnosis not present

## 2023-03-17 NOTE — Progress Notes (Unsigned)
No chief complaint on file.      Bonnie Stone is a 47 y.o. female   Increased seizure frequency More frequent headaches, history of migraine headaches,  Continue lamotrigine ER 300mg  nightly as confusional episodes greatly improved  Restart Ubrelvy for migraine rescue  Advised to monitor transient off-balance sensation which is chronic and some worsening over the past couple of weeks, if does not improve may consider further evaluation   Question underlying stress, depression and anxiety contributing to symptoms  EEG 01/2022 unremarkable    Follow-up in 4 months with Dr. Terrace Arabia for further recommendations or call earlier if needed      DIAGNOSTIC DATA (LABS, IMAGING, TESTING) - I reviewed patient records, labs, notes, testing and imaging myself where available.  EEG 01/06/2022 CONCLUSION: This is a  normal awake EEG.  There is no electrodiagnostic evidence of epileptiform discharge.   MRI of the brain without contrast July 13 2020:: 1. No acute intracranial abnormality. 2. Multifocal hyperintense T2-weighted signal within the brainstem, which may be a sequela of chronic small vessel ischemia.  CT HEAD on July 16, 2020   Normal head CT.  No acute intracranial abnormality.   CTA HEAD AND NECK IMPRESSION:   Normal CTA of the head and neck. No large vessel occlusion, hemodynamically significant stenosis, or other acute vascular abnormality. No aneurysm.   CT VENOGRAM IMPRESSION:   Normal CT venogram.  No evidence for dural sinus thrombosis.   Laboratory evaluation April 2022, CMP, normal creatinine 0.73, albumin 3.4, total protein 6.0, normal CBC, B12, magnesium, alcohol less than 10, normal TSH, A1c 5.5, negative HIV, normal CPK 67,  negative RPR, cocaine was + July 17, 2020, that was confirmed, serum drug screen and urine drug screen was also positive for marijuana THC, negative hepatitis C,  CSF on July 14, 2020, negative for HSV type  I/II,, varicella-zoster PCR, negative culture, WBC 0, RBC 1, protein 27, glucose 59,    Video-EEG monitoring April 13-14 2022. This study is suggestive of mild to moderate diffuse encephalopathy, nonspecific etiology. The excessive beta activity seen in the background is most likely due to the effect of medications like benzodiazepine and is a benign EEG pattern. No seizures or epileptiform discharges were seen throughout the recording.   Multiple events were captured as described above without concomitant eeg change and were NOT epileptic.       HISTORICAL  Update 03/21/2023 JM:  Reports having a seizure back in October, Dr. Terrace Arabia recommended lab work and repeat EEG and follow-up in office in 2 to 3 months.  She never had lab work completed, EEG completed 12/12 ***.       History provided for reference purposes only UPDATE October 12 2022: She is doing well taking current Lamotrigine ER 300mg  at bedtime, also on polypharmacy for bipolar, depression, anxiety, PTSD, Abilify 15mg  qam, zoloft 100mg  at bedtime, gabapentin 300mg  at bedtime,   1 seizure-like spell was a month ago, she smells cat urine,  had a headache with it, went to sleep, woke up not feeling much better   She rarely take Bernita Raisin, does help her headache,   Update 05/13/2022 JM: Patient returns for 57-month follow-up unaccompanied.  Repeated EEG due to increased frequency of confusion spells which was unremarkable.  Lamotrigine dosage increased at prior visit.    She reports occasional confusional spells since prior visit but definitely improved since increasing lamotrigine dosage.  Tolerating current dosage well.  She describes these more recent  events losing train of thought during conversation but denies any extreme confusion events or smelling weird smell prior to symptom onset.  She does note chronic history of intermittently feeling off balance and leaning towards the left, has seemed to worsen slightly over the past 2 weeks,  can last up to 1 hr then resolve.  Unable to identify any triggers.  Denies any recent medication changes.  She continues to experience headaches a couple times per week, prior use of Ubrelvy or Fioricet with benefit but has been out of refills.   UPDATE Sept 28 2023 Dr. Terrace Arabia: She is accompanied by her husband at today's clinical visit, complains of increased headache stress recently, there was increased frequency of transient confusion spells, has been able to see it, glazed look in her eyes, lasting for few minutes, followed by extreme confusion, patient often smelled burnt popcorn or some weird smells prior to symptom onset, it can happen up to twice a week   UPDATE 01/08/2021 Dr. Terrace Arabia: She is with her daughter at home, confirmed the medication lamotrigine ER 200 mg every night, Zoloft 50 mg daily, reported transient loss of consciousness, that was witnessed by daughter, patient has no recollection of the event,  But today her main concern is 3 days history of severe right side headache, she did have a history of migraine in the past, also has mild headaches, take over-the-counter medications, has not had severe headache like this for many months, she complains of light noise sensitivity, nauseous, failed multiple home medications,  Reviewed MRI of the brain in April 2022, multifocal small vessel disease, not a good candidate for triptan treatment,  Consult visit 08/12/2020 Dr. Terrace Arabia: [name] is a 47 year old female, seen in request by her primary care physician Dr.   Lum Babe, Al Dec[facility] T for evaluation of seizure, initial evaluation was with her husband on Aug 12, 2020  I reviewed and summarized the referring note.  Past medical history Illicit drug use, UDS was positive for marijuana and cocaine, Smoking Mood disorder, but was never treated,  On July 13, 2020, she was grocery shopping with her 52 year old daughter, on her way to check out, she felt dizzy, was able to manage to the parking  lot, then lost consciousness, daughter reported weakness generalized tonic-clonic activity, she woke up with paramedics around her, postevent confusion  She was admitted to the hospital, had extensive evaluations, MRI of the brain showed multifocal small vessel disease, mainly involving brainstem, no acute abnormality, CT angiogram of head and neck, CT venogram of the brain showed no significant abnormality  UDS was positive for marijuana  She was discharged to home on July 16, 2020, husband reported she was noted to be very weak, needs to be supported back to home, the same day of discharge few hours later, she was noted to have recurrent seizure-like spells, patient was able to prescribe she often has the aura of smelling ammonia before the onset of body jerking movement,  She also had spinal tap July 14, 2020, there was no significant abnormality found, but the second UDS on April 14 was positive for both marijuana and cocaine,        REVIEW OF SYSTEMS:  Full 14 system review of systems performed and notable only for as above All other review of systems were negative.  PHYSICAL EXAM:  There were no vitals filed for this visit.   There is no height or weight on file to calculate BMI.   PHYSICAL EXAMNIATION:  Gen: NAD,  conversant, well nourised, well groomed                     Cardiovascular: Regular rate rhythm, no peripheral edema, warm, nontender. Eyes: Conjunctivae clear without exudates or hemorrhage Neck: Supple, no carotid bruits. Pulmonary: Clear to auscultation bilaterally   NEUROLOGICAL EXAM:  MENTAL STATUS: Speech/cognition: Depressed looking middle-age female, alert oriented to history taking and casual conversation  CRANIAL NERVES: CN II: Visual fields are full to confrontation.  Pupils are round equal and briskly reactive to light. CN III, IV, VI: extraocular movement are normal. No ptosis. CN V: Facial sensation is intact to pinprick in all 3 divisions  bilaterally. Corneal responses are intact.  CN VII: Face is symmetric with normal eye closure and smile. CN VIII: Hearing is normal to casual conversation CN IX, X: Palate elevates symmetrically. Phonation is normal. CN XI: Head turning and shoulder shrug are intact   MOTOR: There is no pronator drift of out-stretched arms. Muscle bulk and tone are normal. Muscle strength is normal.  REFLEXES: Reflexes are 2+ and symmetric at the biceps, triceps, knees, and ankles. Plantar responses are flexor.  SENSORY: Intact to light touch, pinprick, positional and vibratory sensation are intact in fingers and toes.  COORDINATION: There is no dysmetria on finger-to-nose and heel-knee-shin.    GAIT/STANCE: Posture is normal. Gait is steady    Allergy: Allergies  Allergen Reactions   Bee Venom Anaphylaxis   Penicillins     HOME MEDICATIONS: Current Outpatient Medications  Medication Sig Dispense Refill   ARIPiprazole (ABILIFY) 15 MG tablet Take 1 tablet (15 mg total) by mouth daily. 90 tablet 1   butalbital-acetaminophen-caffeine (FIORICET) 50-325-40 MG tablet TAKE 1 TABLET BY MOUTH EVERY 6 HOURS AS NEEDED FOR HEADACHE 12 tablet 3   EPINEPHrine 0.3 mg/0.3 mL IJ SOAJ injection Inject 0.3 mLs (0.3 mg total) into the muscle as needed for anaphylaxis. 1 each 1   gabapentin (NEURONTIN) 300 MG capsule TAKE 1 CAPSULE BY MOUTH EVERYDAY AT BEDTIME 30 capsule 5   LamoTRIgine 300 MG TB24 24 hour tablet Take 1 tablet (300 mg total) by mouth at bedtime. 90 tablet 3   ondansetron (ZOFRAN ODT) 4 MG disintegrating tablet Take 1 tablet (4 mg total) by mouth every 8 (eight) hours as needed for nausea or vomiting. 20 tablet 3   rosuvastatin (CRESTOR) 10 MG tablet Take 10 mg by mouth at bedtime.     sertraline (ZOLOFT) 100 MG tablet Take 1.5 tablets (150 mg total) by mouth at bedtime. 135 tablet 0   Ubrogepant (UBRELVY) 100 MG TABS Take 1 tab at onset of migraine.  May repeat in 2 hrs, if needed.  Max dose: 2  tabs/day. This is a 30 day prescription. 15 tablet 11   VENTOLIN HFA 108 (90 Base) MCG/ACT inhaler Inhale 1 puff into the lungs every 8 (eight) hours as needed.     No current facility-administered medications for this visit.    PAST MEDICAL HISTORY: Past Medical History:  Diagnosis Date   Seizure (HCC)     PAST SURGICAL HISTORY: Past Surgical History:  Procedure Laterality Date   CHOLECYSTECTOMY     TUBAL LIGATION      FAMILY HISTORY: Family History  Problem Relation Age of Onset   Seizures Son        Epilepsy   Seizures Son        Febrile seizures   Lung cancer Mother    Other Father  unknown    SOCIAL HISTORY: Social History   Socioeconomic History   Marital status: Married    Spouse name: Not on file   Number of children: 4   Years of education: 12   Highest education level: High school graduate  Occupational History   Occupation: homemaker  Tobacco Use   Smoking status: Some Days    Current packs/day: 2.00    Average packs/day: 2.0 packs/day for 23.0 years (46.0 ttl pk-yrs)    Types: Cigarettes   Smokeless tobacco: Never   Tobacco comments:    Smoking since age of 48 years old    0.5 PPD khj 09/10/2022  Vaping Use   Vaping status: Never Used  Substance and Sexual Activity   Alcohol use: Not Currently    Comment: Drinks once a month with husband   Drug use: Not Currently    Types: Cocaine    Comment: Reports last use 2008 after conception of last child   Sexual activity: Yes    Partners: Male    Birth control/protection: None  Other Topics Concern   Not on file  Social History Narrative   Lives at home with her family.   3-4 cups caffeine per day.   Right-handed.   Social Drivers of Corporate investment banker Strain: Not on file  Food Insecurity: Not on file  Transportation Needs: Not on file  Physical Activity: Not on file  Stress: Not on file  Social Connections: Not on file  Intimate Partner Violence: Not on file      I  spent 29 minutes of face-to-face and non-face-to-face time with patient.  This included previsit chart review, lab review, study review, order entry, electronic health record documentation, patient education and discussion regarding the above diagnoses and treatment Stone and answered all the questions to patient's satisfaction  Ihor Austin, Dr. [facility]  [facility] [address] [place], [place] [address]  Phone [phone] Fax [phone] Note: This document was prepared with digital dictation and possible smart phrase technology. Any transcriptional errors that result from this process are unintentional.

## 2023-03-21 ENCOUNTER — Telehealth: Payer: Self-pay | Admitting: Neurology

## 2023-03-21 ENCOUNTER — Ambulatory Visit: Payer: Medicaid Other | Admitting: Adult Health

## 2023-03-21 NOTE — Telephone Encounter (Signed)
Pt has called to r/s her appointment

## 2023-04-08 ENCOUNTER — Telehealth: Payer: Self-pay | Admitting: Adult Health

## 2023-04-08 NOTE — Telephone Encounter (Signed)
 Pt confirming upcoming appt details
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
## 2023-04-12 ENCOUNTER — Ambulatory Visit (INDEPENDENT_AMBULATORY_CARE_PROVIDER_SITE_OTHER): Payer: Medicaid Other | Admitting: Psychiatry

## 2023-04-12 ENCOUNTER — Encounter: Payer: Self-pay | Admitting: Psychiatry

## 2023-04-12 VITALS — BP 114/74 | HR 93 | Temp 97.9°F | Ht 63.0 in | Wt 163.0 lb

## 2023-04-12 DIAGNOSIS — F4310 Post-traumatic stress disorder, unspecified: Secondary | ICD-10-CM | POA: Diagnosis not present

## 2023-04-12 DIAGNOSIS — F331 Major depressive disorder, recurrent, moderate: Secondary | ICD-10-CM | POA: Diagnosis not present

## 2023-04-12 MED ORDER — PRAZOSIN HCL 1 MG PO CAPS: 1.0000 mg | ORAL_CAPSULE | Freq: Every day | ORAL | 0 refills | Status: DC

## 2023-04-12 MED ORDER — PRAZOSIN HCL 2 MG PO CAPS: 2.0000 mg | ORAL_CAPSULE | Freq: Every day | ORAL | 1 refills | Status: DC

## 2023-04-12 NOTE — Patient Instructions (Signed)
 Continue sertraline 150 mg daily  Continue Abilify 15 mg at night  Start prazosin 1 mg at night for 3 days, then 2 mg at night  Next appointment: 2/20 at 3:30

## 2023-04-14 NOTE — Progress Notes (Signed)
 Chief Complaint  Patient presents with   Follow-up    Pt with husband rm 3.  Overall states everything stable. She has not had any seizures.        ASSESSMENT AND PLAN  Bonnie Stone is a 48 y.o. female   Increased seizure frequency More frequent headaches, history of migraine headaches, Polypharmacy d/t mood disorder  Overall stable, no recent seizures Continue lamotrigine  ER 300mg  nightly - refill provided  Repeat EEG 03/17/2023 - not yet read, will f/u with Dr. Onita regarding this  EEG 01/2022 unremarkable  Obtain lamotrigine  level today    Follow-up in 1 year or call earlier if needed      DIAGNOSTIC DATA (LABS, IMAGING, TESTING) - I reviewed patient records, labs, notes, testing and imaging myself where available.  EEG 01/06/2022 CONCLUSION: This is a  normal awake EEG.  There is no electrodiagnostic evidence of epileptiform discharge.   MRI of the brain without contrast July 13 2020:: 1. No acute intracranial abnormality. 2. Multifocal hyperintense T2-weighted signal within the brainstem, which may be a sequela of chronic small vessel ischemia.  CT HEAD on July 16, 2020   Normal head CT.  No acute intracranial abnormality.   CTA HEAD AND NECK IMPRESSION:   Normal CTA of the head and neck. No large vessel occlusion, hemodynamically significant stenosis, or other acute vascular abnormality. No aneurysm.   CT VENOGRAM IMPRESSION:   Normal CT venogram.  No evidence for dural sinus thrombosis.   Laboratory evaluation April 2022, CMP, normal creatinine 0.73, albumin 3.4, total protein 6.0, normal CBC, B12, magnesium, alcohol less than 10, normal TSH, A1c 5.5, negative HIV, normal CPK 67,  negative RPR, cocaine was + July 17, 2020, that was confirmed, serum drug screen and urine drug screen was also positive for marijuana THC, negative hepatitis C,  CSF on July 14, 2020, negative for HSV type I/II,, varicella-zoster PCR, negative culture, WBC 0, RBC  1, protein 27, glucose 59,    Video-EEG monitoring April 13-14 2022. This study is suggestive of mild to moderate diffuse encephalopathy, nonspecific etiology. The excessive beta activity seen in the background is most likely due to the effect of medications like benzodiazepine and is a benign EEG pattern. No seizures or epileptiform discharges were seen throughout the recording.   Multiple events were captured as described above without concomitant eeg change and were NOT epileptic.       HISTORICAL  Update 04/18/2023 JM:  Reports having a seizure back in October, Dr. Onita recommended lab work to check lamotrigine  level and repeat EEG and follow-up in office in 2 to 3 months.    Reports completion of EEG on 12/12 (although reports as being cancelled via epic), confirmed with sleep lab that EEG was in fact completed on 12/12 but has not yet been read for unclear reason   Denies any additional seizure activity.  Tolerating lamotrigine  well without side effects.      History provided for reference purposes only UPDATE October 12 2022: She is doing well taking current Lamotrigine  ER 300mg  at bedtime, also on polypharmacy for bipolar, depression, anxiety, PTSD, Abilify  15mg  qam, zoloft  100mg  at bedtime, gabapentin  300mg  at bedtime,   1 seizure-like spell was a month ago, she smells cat urine,  had a headache with it, went to sleep, woke up not feeling much better   She rarely take Ubrelvy , does help her headache,   Update 05/13/2022 JM: Patient returns for 22-month follow-up unaccompanied.  Repeated EEG due  to increased frequency of confusion spells which was unremarkable.  Lamotrigine  dosage increased at prior visit.    She reports occasional confusional spells since prior visit but definitely improved since increasing lamotrigine  dosage.  Tolerating current dosage well.  She describes these more recent events losing train of thought during conversation but denies any extreme confusion events  or smelling weird smell prior to symptom onset.  She does note chronic history of intermittently feeling off balance and leaning towards the left, has seemed to worsen slightly over the past 2 weeks, can last up to 1 hr then resolve.  Unable to identify any triggers.  Denies any recent medication changes.  She continues to experience headaches a couple times per week, prior use of Ubrelvy  or Fioricet with benefit but has been out of refills.   UPDATE Sept 28 2023 Dr. Onita: She is accompanied by her husband at today's clinical visit, complains of increased headache stress recently, there was increased frequency of transient confusion spells, has been able to see it, glazed look in her eyes, lasting for few minutes, followed by extreme confusion, patient often smelled burnt popcorn or some weird smells prior to symptom onset, it can happen up to twice a week   UPDATE 01/08/2021 Dr. Onita: She is with her daughter at home, confirmed the medication lamotrigine  ER 200 mg every night, Zoloft  50 mg daily, reported transient loss of consciousness, that was witnessed by daughter, patient has no recollection of the event,  But today her main concern is 3 days history of severe right side headache, she did have a history of migraine in the past, also has mild headaches, take over-the-counter medications, has not had severe headache like this for many months, she complains of light noise sensitivity, nauseous, failed multiple home medications,  Reviewed MRI of the brain in April 2022, multifocal small vessel disease, not a good candidate for triptan treatment,  Consult visit 08/12/2020 Dr. Onita: Bonnie Stone is a 48 year old female, seen in request by her primary care physician Dr.   Anders, Otto T for evaluation of seizure, initial evaluation was with her husband on Aug 12, 2020  I reviewed and summarized the referring note.  Past medical history Illicit drug use, UDS was positive for marijuana and  cocaine, Smoking Mood disorder, but was never treated,  On July 13, 2020, she was grocery shopping with her 98 year old daughter, on her way to check out, she felt dizzy, was able to manage to the parking lot, then lost consciousness, daughter reported weakness generalized tonic-clonic activity, she woke up with paramedics around her, postevent confusion  She was admitted to the hospital, had extensive evaluations, MRI of the brain showed multifocal small vessel disease, mainly involving brainstem, no acute abnormality, CT angiogram of head and neck, CT venogram of the brain showed no significant abnormality  UDS was positive for marijuana  She was discharged to home on July 16, 2020, husband reported she was noted to be very weak, needs to be supported back to home, the same day of discharge few hours later, she was noted to have recurrent seizure-like spells, patient was able to prescribe she often has the aura of smelling ammonia before the onset of body jerking movement,  She also had spinal tap July 14, 2020, there was no significant abnormality found, but the second UDS on April 14 was positive for both marijuana and cocaine,        REVIEW OF SYSTEMS:  Full 14 system review of systems performed  and notable only for as above All other review of systems were negative.  PHYSICAL EXAM:  Today's Vitals   04/18/23 0805  BP: 105/76  Pulse: (!) 43  Weight: 162 lb (73.5 kg)  Height: 5' 3 (1.6 m)     Body mass index is 28.7 kg/m.   PHYSICAL EXAMNIATION:  Gen: NAD, conversant, well nourised, well groomed                     Cardiovascular: Regular rate rhythm, no peripheral edema, warm, nontender. Eyes: Conjunctivae clear without exudates or hemorrhage Neck: Supple, no carotid bruits. Pulmonary: Clear to auscultation bilaterally   NEUROLOGICAL EXAM:  MENTAL STATUS: Speech/cognition: Depressed looking middle-age female, alert oriented to history taking and casual  conversation  CRANIAL NERVES: CN II: Visual fields are full to confrontation.  Pupils are round equal and briskly reactive to light. CN III, IV, VI: extraocular movement are normal. No ptosis. CN V: Facial sensation is intact  CN VII: Face is symmetric with normal eye closure and smile. CN VIII: Hearing is normal to casual conversation CN IX, X: Palate elevates symmetrically. Phonation is normal. CN XI: Head turning and shoulder shrug are intact   MOTOR: There is no pronator drift of out-stretched arms. Muscle bulk and tone are normal. Muscle strength is normal.  REFLEXES: Reflexes are 2+ and symmetric at the biceps, triceps, knees, and ankles. Plantar responses are flexor.  SENSORY: Intact to light touch, pinprick, positional and vibratory sensation are intact in fingers and toes.  COORDINATION: There is no dysmetria on finger-to-nose and heel-knee-shin.    GAIT/STANCE: Posture is normal. Gait is steady    Allergy: Allergies  Allergen Reactions   Bee Venom Anaphylaxis   Penicillins     HOME MEDICATIONS: Current Outpatient Medications  Medication Sig Dispense Refill   ARIPiprazole  (ABILIFY ) 15 MG tablet Take 1 tablet (15 mg total) by mouth daily. 90 tablet 1   EPINEPHrine  0.3 mg/0.3 mL IJ SOAJ injection Inject 0.3 mLs (0.3 mg total) into the muscle as needed for anaphylaxis. 1 each 1   gabapentin  (NEURONTIN ) 300 MG capsule TAKE 1 CAPSULE BY MOUTH EVERYDAY AT BEDTIME 30 capsule 5   prazosin  (MINIPRESS ) 2 MG capsule Take 1 capsule (2 mg total) by mouth at bedtime. Start after completing 1 mg at night for 3 days 30 capsule 1   rosuvastatin (CRESTOR) 10 MG tablet Take 10 mg by mouth at bedtime.     sertraline  (ZOLOFT ) 100 MG tablet Take 1.5 tablets (150 mg total) by mouth at bedtime. 135 tablet 0   VENTOLIN HFA 108 (90 Base) MCG/ACT inhaler Inhale 1 puff into the lungs every 8 (eight) hours as needed.     LamoTRIgine  300 MG TB24 24 hour tablet Take 1 tablet (300 mg total) by  mouth at bedtime. 90 tablet 3   No current facility-administered medications for this visit.    PAST MEDICAL HISTORY: Past Medical History:  Diagnosis Date   Seizure (HCC)     PAST SURGICAL HISTORY: Past Surgical History:  Procedure Laterality Date   CHOLECYSTECTOMY     TUBAL LIGATION      FAMILY HISTORY: Family History  Problem Relation Age of Onset   Seizures Son        Epilepsy   Seizures Son        Febrile seizures   Lung cancer Mother    Other Father        unknown    SOCIAL HISTORY: Social History  Socioeconomic History   Marital status: Married    Spouse name: Not on file   Number of children: 4   Years of education: 12   Highest education level: High school graduate  Occupational History   Occupation: homemaker  Tobacco Use   Smoking status: Some Days    Current packs/day: 2.00    Average packs/day: 2.0 packs/day for 23.0 years (46.0 ttl pk-yrs)    Types: Cigarettes   Smokeless tobacco: Never   Tobacco comments:    Smoking since age of 47 years old    0.5 PPD khj 09/10/2022  Vaping Use   Vaping status: Never Used  Substance and Sexual Activity   Alcohol use: Not Currently    Comment: Drinks once a month with husband   Drug use: Not Currently    Types: Cocaine    Comment: Reports last use 2008 after conception of last child   Sexual activity: Yes    Partners: Male    Birth control/protection: None  Other Topics Concern   Not on file  Social History Narrative   Lives at home with her family.   3-4 cups caffeine  per day.   Right-handed.   Social Drivers of Corporate Investment Banker Strain: Not on file  Food Insecurity: Not on file  Transportation Needs: Not on file  Physical Activity: Not on file  Stress: Not on file  Social Connections: Not on file  Intimate Partner Violence: Not on file      I spent 15 minutes of face-to-face and non-face-to-face time with patient.  This included previsit chart review, lab review, study review,  order entry, electronic health record documentation, patient education and discussion regarding the above diagnoses and treatment plan and answered all the questions to patient's satisfaction  Harlene Bogaert, [facility]  [facility] [address] [place], [place] [address]  Phone [phone] Fax [phone] Note: This document was prepared with digital dictation and possible smart phrase technology. Any transcriptional errors that result from this process are unintentional.

## 2023-04-18 ENCOUNTER — Ambulatory Visit: Payer: Medicaid Other | Admitting: Adult Health

## 2023-04-18 ENCOUNTER — Encounter: Payer: Self-pay | Admitting: Adult Health

## 2023-04-18 VITALS — BP 105/76 | HR 43 | Ht 63.0 in | Wt 162.0 lb

## 2023-04-18 DIAGNOSIS — Z5181 Encounter for therapeutic drug level monitoring: Secondary | ICD-10-CM

## 2023-04-18 DIAGNOSIS — G40909 Epilepsy, unspecified, not intractable, without status epilepticus: Secondary | ICD-10-CM

## 2023-04-18 MED ORDER — LAMOTRIGINE ER 300 MG PO TB24: 300.0000 mg | ORAL_TABLET | Freq: Every evening | ORAL | 3 refills | Status: AC

## 2023-04-18 NOTE — Patient Instructions (Addendum)
 Your Plan:  Continue lamotrigine  300mg  nightly for seizure prevention  We will check lab work today  Will follow up with Dr. Onita regarding EEG results      Follow up in 1 year or call earlier if needed     Thank you for coming to see us  at [facility]. I hope we have been able to provide you high quality care today.  You may receive a patient satisfaction survey over the next few weeks. We would appreciate your feedback and comments so that we may continue to improve ourselves and the health of our patients.

## 2023-04-19 LAB — LAMOTRIGINE LEVEL: Lamotrigine Lvl: 3.9 ug/mL (ref 2.0–20.0)
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
## 2023-05-26 ENCOUNTER — Encounter: Payer: Self-pay | Admitting: Psychiatry

## 2023-05-26 ENCOUNTER — Telehealth: Payer: Medicaid Other | Admitting: Psychiatry

## 2023-05-26 DIAGNOSIS — F3341 Major depressive disorder, recurrent, in partial remission: Secondary | ICD-10-CM | POA: Diagnosis not present

## 2023-05-26 DIAGNOSIS — F4310 Post-traumatic stress disorder, unspecified: Secondary | ICD-10-CM | POA: Diagnosis not present

## 2023-05-26 MED ORDER — PRAZOSIN HCL 2 MG PO CAPS: 2.0000 mg | ORAL_CAPSULE | Freq: Every day | ORAL | 0 refills | Status: DC

## 2023-05-26 MED ORDER — SERTRALINE HCL 100 MG PO TABS: 150.0000 mg | ORAL_TABLET | Freq: Every day | ORAL | 0 refills | Status: DC

## 2023-05-26 NOTE — Patient Instructions (Signed)
Continue sertraline 150 mg daily  Continue Abilify 15 mg at night  Continue prazosin 2 mg at night  Next appointment:4/17 at 4 30

## 2023-06-30 ENCOUNTER — Other Ambulatory Visit: Payer: Self-pay | Admitting: Adult Health

## 2023-07-01 NOTE — Telephone Encounter (Signed)
 Last seen on 04/18/23 Follow up scheduled on 04/17/24
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
## 2023-07-21 ENCOUNTER — Encounter: Payer: Self-pay | Admitting: Psychiatry

## 2023-07-21 ENCOUNTER — Ambulatory Visit (INDEPENDENT_AMBULATORY_CARE_PROVIDER_SITE_OTHER): Payer: Medicaid Other | Admitting: Psychiatry

## 2023-07-21 VITALS — BP 118/84 | HR 90 | Temp 98.7°F | Ht 63.0 in | Wt 153.0 lb

## 2023-07-21 DIAGNOSIS — F4310 Post-traumatic stress disorder, unspecified: Secondary | ICD-10-CM | POA: Diagnosis not present

## 2023-07-21 DIAGNOSIS — F331 Major depressive disorder, recurrent, moderate: Secondary | ICD-10-CM | POA: Diagnosis not present

## 2023-07-21 MED ORDER — ARIPIPRAZOLE 15 MG PO TABS: 15.0000 mg | ORAL_TABLET | Freq: Every day | ORAL | 1 refills | Status: DC

## 2023-07-21 MED ORDER — SERTRALINE HCL 100 MG PO TABS: 150.0000 mg | ORAL_TABLET | Freq: Every day | ORAL | 0 refills | Status: DC

## 2023-07-21 NOTE — Patient Instructions (Signed)
 Continue sertraline 150 mg daily  Continue Abilify 15 mg at night  Continue prazosin 2 mg at night  Next appointment 6/2 at 4:30

## 2023-07-25 ENCOUNTER — Ambulatory Visit: Payer: Medicaid Other | Admitting: Adult Health

## 2023-08-04 ENCOUNTER — Ambulatory Visit: Payer: Medicaid Other | Admitting: Adult Health
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
## 2023-09-05 ENCOUNTER — Ambulatory Visit: Admitting: Psychiatry
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
## 2023-09-08 ENCOUNTER — Telehealth (INDEPENDENT_AMBULATORY_CARE_PROVIDER_SITE_OTHER): Admitting: Psychiatry

## 2023-09-08 ENCOUNTER — Encounter: Payer: Self-pay | Admitting: Psychiatry

## 2023-09-08 DIAGNOSIS — F331 Major depressive disorder, recurrent, moderate: Secondary | ICD-10-CM | POA: Diagnosis not present

## 2023-09-08 DIAGNOSIS — F4310 Post-traumatic stress disorder, unspecified: Secondary | ICD-10-CM | POA: Diagnosis not present

## 2023-09-08 MED ORDER — PRAZOSIN HCL 2 MG PO CAPS: 2.0000 mg | ORAL_CAPSULE | Freq: Every day | ORAL | 0 refills | Status: DC

## 2023-09-08 MED ORDER — PRAZOSIN HCL 1 MG PO CAPS: 1.0000 mg | ORAL_CAPSULE | Freq: Every day | ORAL | 1 refills | Status: DC
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
## 2023-10-24 ENCOUNTER — Encounter: Payer: Self-pay | Admitting: Psychiatry

## 2023-10-24 ENCOUNTER — Telehealth (INDEPENDENT_AMBULATORY_CARE_PROVIDER_SITE_OTHER): Admitting: Psychiatry

## 2023-10-24 DIAGNOSIS — F330 Major depressive disorder, recurrent, mild: Secondary | ICD-10-CM

## 2023-10-24 DIAGNOSIS — F4310 Post-traumatic stress disorder, unspecified: Secondary | ICD-10-CM

## 2023-10-24 MED ORDER — SERTRALINE HCL 100 MG PO TABS: 150.0000 mg | ORAL_TABLET | Freq: Every day | ORAL | 0 refills | Status: DC

## 2023-10-24 MED ORDER — PRAZOSIN HCL 1 MG PO CAPS
1.0000 mg | ORAL_CAPSULE | Freq: Every day | ORAL | 0 refills | Status: AC
Start: 2023-11-07 — End: 2024-02-05

## 2023-10-24 NOTE — Patient Instructions (Signed)
 Continue sertraline  150 mg daily   Continue Abilify  15 mg at night  Continue prazosin  3 mg at night  Next appointment  9/2 at 4:30

## 2023-11-16 ENCOUNTER — Ambulatory Visit: Admitting: Professional Counselor

## 2023-11-16 DIAGNOSIS — F331 Major depressive disorder, recurrent, moderate: Secondary | ICD-10-CM | POA: Diagnosis not present

## 2023-11-16 DIAGNOSIS — F4310 Post-traumatic stress disorder, unspecified: Secondary | ICD-10-CM
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
## 2023-12-06 ENCOUNTER — Ambulatory Visit (INDEPENDENT_AMBULATORY_CARE_PROVIDER_SITE_OTHER): Admitting: Psychiatry

## 2023-12-06 ENCOUNTER — Encounter: Payer: Self-pay | Admitting: Psychiatry

## 2023-12-06 VITALS — BP 118/82 | HR 85 | Temp 98.7°F | Ht 63.0 in | Wt 149.0 lb

## 2023-12-06 DIAGNOSIS — F4310 Post-traumatic stress disorder, unspecified: Secondary | ICD-10-CM | POA: Diagnosis not present

## 2023-12-06 DIAGNOSIS — F331 Major depressive disorder, recurrent, moderate: Secondary | ICD-10-CM

## 2023-12-06 MED ORDER — ARIPIPRAZOLE 15 MG PO TABS: 15.0000 mg | ORAL_TABLET | Freq: Every day | ORAL | 1 refills | Status: AC

## 2023-12-06 MED ORDER — PRAZOSIN HCL 2 MG PO CAPS: 2.0000 mg | ORAL_CAPSULE | Freq: Every day | ORAL | 0 refills | Status: AC

## 2023-12-06 MED ORDER — MIRTAZAPINE 15 MG PO TABS: ORAL_TABLET | ORAL | 1 refills | Status: DC

## 2023-12-06 NOTE — Patient Instructions (Signed)
 Continue sertraline  150 mg daily  Start mirtazapine  7,5 mg at night for one week, then 15 mg at night  Continue Abilify  15 mg at night  Continue prazosin  3 mg at night  Next appointment: 10/28 at 2:30

## 2023-12-07 ENCOUNTER — Telehealth: Payer: Self-pay

## 2023-12-07 NOTE — Telephone Encounter (Signed)
 went online and received notice that a prior auth was needed for the aripiprazole  .

## 2023-12-07 NOTE — Telephone Encounter (Signed)
faxed and confirmed approval notice.  

## 2023-12-07 NOTE — Telephone Encounter (Signed)
 went online and submitted the prior auth it was approved from 12-07-23 to 12-06-24

## 2023-12-15 ENCOUNTER — Ambulatory Visit: Admitting: Professional Counselor

## 2023-12-15 DIAGNOSIS — F4310 Post-traumatic stress disorder, unspecified: Secondary | ICD-10-CM | POA: Diagnosis not present

## 2023-12-15 DIAGNOSIS — F331 Major depressive disorder, recurrent, moderate: Secondary | ICD-10-CM

## 2023-12-15 NOTE — Progress Notes (Signed)
 THERAPIST PROGRESS NOTE  Virtual Visit via Video Note  I connected with Bonnie Stone on 12/15/23 at  9:00 AM EDT by a video enabled telemedicine application and verified that I am speaking with the correct person using two identifiers.  Location: Patient: Home Provider: Remote office    I discussed the limitations of evaluation and management by telemedicine and the availability of in person appointments. The patient expressed understanding and agreed to proceed.   I discussed the assessment and treatment plan with the patient. The patient was provided an opportunity to ask questions and all were answered. The patient agreed with the plan and demonstrated an understanding of the instructions.   The patient was advised to call back or seek an in-person evaluation if the symptoms worsen or if the condition fails to improve as anticipated.  I provided 27 minutes of non-face-to-face time during this encounter. Bonnie Stone, Bonnie Stone  Session Time: 9:01 AM - 9:28 AM   Participation Level: Active  Behavioral Response: Casual, Alert, Dysphoric  Type of Therapy: Individual Therapy  Treatment Goals addressed: Active OP Depression  LTG: I want me to change so that I can accept everything that's going on.    Start:  12/15/23    Expected End:  12/13/24     STG: My anxiety is really high. To reduce sxs of anxiety AEB reduction in GAD7 scores by utilizing coping mechanisms over the next 90 days.    STG: Sometimes I can't get out of bed. I stay in the house. I don't like to go nowhere. To reduce sxs of depression and improve daily functioning AEB reduction in PHQ9 scores by 25% over the next 90 days.    ProgressTowards Goals: Initial  Interventions: Motivational Interviewing, Supportive, and Other: Coping skills  Summary: Bonnie Stone is a 48 y.o. female who presents with a history of depression and trauma. She appeared somber but oriented x5. She initially stated not much  has happened since her initial assessment, but when she went outside for her session, she shared more information. She noted family discord and dysfunction. She reported she is struggling to manage these stressors. Bonnie Stone engaged in developing her treatment plan and agreed with consent form. She actively listened to coping skills and engaged in 5-4-3-2-1 grounding skill. She reported she will practice between now and next session.   Therapist Response: Conducted session with Bonnie Stone. Began session with check-in/update since previous session. Utilized empathetic and reflective listening. Used open-ended questions to facilitate discussion and summarized Bonnie Stone's thoughts/feelings. Developed treatment plan with input from Bonnie Stone on current strengths, needs, and progress towards goals. Provided psychoeducation on coping skills (breathing exercises, TIP, butterfly hugs) and engaged Bonnie Stone in Curator. Emailed copies so Bonnie Stone can practice between now and next session. Scheduled additional appointment and concluded session.   Suicidal/Homicidal: No  Plan: Return again in 2 weeks.  Diagnosis: MDD (major depressive disorder), recurrent episode, moderate (HCC)  PTSD (post-traumatic stress disorder)  Collaboration of Care: Medication Management AEB chart review  Patient/Guardian was advised Release of Information must be obtained prior to any record release in order to collaborate their care with an outside provider. Patient/Guardian was advised if they have not already done so to contact the registration department to sign all necessary forms in order for us  to release information regarding their care.   Consent: Patient/Guardian gives verbal consent for treatment and assignment of benefits for services provided during this visit. Patient/Guardian expressed understanding and agreed to proceed.  Bonnie Stone, Bonnie Stone 12/15/2023

## 2023-12-29 ENCOUNTER — Ambulatory Visit (INDEPENDENT_AMBULATORY_CARE_PROVIDER_SITE_OTHER): Admitting: Professional Counselor

## 2023-12-29 DIAGNOSIS — Z91199 Patient's noncompliance with other medical treatment and regimen due to unspecified reason: Secondary | ICD-10-CM
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
## 2024-01-02 ENCOUNTER — Other Ambulatory Visit: Payer: Self-pay | Admitting: Psychiatry

## 2024-01-02 ENCOUNTER — Ambulatory Visit (INDEPENDENT_AMBULATORY_CARE_PROVIDER_SITE_OTHER): Admitting: Professional Counselor

## 2024-01-02 DIAGNOSIS — F4310 Post-traumatic stress disorder, unspecified: Secondary | ICD-10-CM

## 2024-01-02 DIAGNOSIS — F331 Major depressive disorder, recurrent, moderate: Secondary | ICD-10-CM | POA: Diagnosis not present
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
## 2024-01-12 ENCOUNTER — Ambulatory Visit (INDEPENDENT_AMBULATORY_CARE_PROVIDER_SITE_OTHER): Admitting: Professional Counselor

## 2024-01-12 DIAGNOSIS — F4310 Post-traumatic stress disorder, unspecified: Secondary | ICD-10-CM | POA: Diagnosis not present

## 2024-01-12 DIAGNOSIS — F331 Major depressive disorder, recurrent, moderate: Secondary | ICD-10-CM | POA: Diagnosis not present

## 2024-01-12 NOTE — Progress Notes (Signed)
 THERAPIST PROGRESS NOTE  Virtual Visit via Video Note  I connected with Bonnie Stone on 01/12/24 at  9:00 AM EDT by a video enabled telemedicine application and verified that I am speaking with the correct person using two identifiers.  Location: Patient: Son's house Provider: Office   I discussed the limitations of evaluation and management by telemedicine and the availability of in person appointments. The patient expressed understanding and agreed to proceed.  I discussed the assessment and treatment plan with the patient. The patient was provided an opportunity to ask questions and all were answered. The patient agreed with the plan and demonstrated an understanding of the instructions.   The patient was advised to call back or seek an in-person evaluation if the symptoms worsen or if the condition fails to improve as anticipated.  I provided 33 minutes of non-face-to-face time during this encounter. Bonnie Stone, [facility]  Session Time: 9:00 AM - 9:33 AM   Participation Level: Active  Behavioral Response: Casual, Alert, Dysphoric  Type of Therapy: Individual Therapy  Treatment Goals addressed: Active OP Depression  LTG: I want me to change so that I can accept everything that's going on.                Start:  12/15/23    Expected End:  12/13/24      STG: My anxiety is really high. To reduce sxs of anxiety AEB reduction in GAD7 scores by utilizing coping mechanisms over the next 90 days.     STG: Sometimes I can't get out of bed. I stay in the house. I don't like to go nowhere. To reduce sxs of depression and improve daily functioning AEB reduction in PHQ9 scores by 25% over the next 90 days.    ProgressTowards Goals: Progressing  Interventions: CBT, Motivational Interviewing, Solution Focused, and Supportive  Summary: Bonnie Stone is a 48 y.o. female who presents with depression and trauma. She appeared somber but oriented x5. She is still staying with  her son. She reported her husband is staying with her son's ex-girlfriend, who he's been having an affair with. Bonnie Stone expressed feelings of guilt around this wondering what she did wrong. She was receptive to natural vs manufactured emotions and restructuring thoughts of self-blame. She noted she will likely be trying to get a divorce but wants him to fix her house first. She was receptive to resources through Legal Aid and thankful for assistance registering for divorce listen and learn class. Bonnie Stone feels uncertain about how to process her feelings but is willing to try. She was receptive to using Medicaid transportation to allow for in-person sessions to reduce distractions during virtual sessions.   Therapist Response: Conducted session with Bonnie Stone. Began session with check-in/update since previous session. Utilized empathetic and reflective listening. Used open-ended questions to facilitate discussion and summarized Bonnie Stone's thoughts/feelings. Explained difference between natural vs manufactured emotions and levels of responsibility in events. Encouraged Bonnie Stone to work on shifting blame from self to other parties involved. Provided resource with [employer] [facility] [place] and assisted with registering Bonnie Stone for listen and learn session. Encouraged Bonnie Stone to use coping skills and allow herself to feel emotions. Shared Medicaid transportation as resource to allow in-person sessions and reduce distractions during virtual sessions. Scheduled additional appointment and concluded session.   Suicidal/Homicidal: No  Plan: Return again in 2 weeks.  Diagnosis: PTSD (post-traumatic stress disorder)  MDD (major depressive disorder), recurrent episode, moderate (HCC)  Collaboration of Care: Medication Management AEB chart review  Patient/Guardian was advised Release of Information must be obtained prior to any record release in order to collaborate their care with an outside provider. Patient/Guardian was advised if they have not  already done so to contact the registration department to sign all necessary forms in order for us  to release information regarding their care.   Consent: Patient/Guardian gives verbal consent for treatment and assignment of benefits for services provided during this visit. Patient/Guardian expressed understanding and agreed to proceed.   Bonnie Stone, [facility] 01/12/2024

## 2024-01-26 ENCOUNTER — Ambulatory Visit: Admitting: Professional Counselor

## 2024-01-27 ENCOUNTER — Ambulatory Visit: Admitting: Professional Counselor
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
## 2024-01-31 ENCOUNTER — Ambulatory Visit: Admitting: Psychiatry

## 2024-02-09 ENCOUNTER — Other Ambulatory Visit: Payer: Self-pay | Admitting: Psychiatry

## 2024-02-09 ENCOUNTER — Ambulatory Visit: Admitting: Professional Counselor

## 2024-02-09 NOTE — Telephone Encounter (Signed)
 Please advise to make a follow up. I can see her for virtual if any sooner available slot.

## 2024-02-10 ENCOUNTER — Telehealth: Payer: Self-pay | Admitting: Psychiatry

## 2024-02-10 NOTE — Telephone Encounter (Signed)
 Patient has not shown for appointment for provider nor therapy. Message left this morning to offer virtual appointment with provider and to discuss therapy if still interested.

## 2024-02-22 ENCOUNTER — Other Ambulatory Visit: Payer: Self-pay | Admitting: Psychiatry

## 2024-02-22 ENCOUNTER — Other Ambulatory Visit: Payer: Self-pay | Admitting: Adult Health

## 2024-02-22 DIAGNOSIS — F4310 Post-traumatic stress disorder, unspecified: Secondary | ICD-10-CM

## 2024-02-23 ENCOUNTER — Telehealth: Payer: Self-pay | Admitting: Psychiatry

## 2024-02-23 NOTE — Telephone Encounter (Signed)
 Received refill request for sertraline .  I have provided 30-day supply only for now.  Patient does not have a follow-up appointment scheduled.

## 2024-03-27 ENCOUNTER — Other Ambulatory Visit: Payer: Self-pay | Admitting: Psychiatry

## 2024-03-27 DIAGNOSIS — F4310 Post-traumatic stress disorder, unspecified: Secondary | ICD-10-CM

## 2024-04-16 ENCOUNTER — Telehealth: Payer: Self-pay | Admitting: Adult Health

## 2024-04-16 NOTE — Telephone Encounter (Signed)
 MY[facility] conf
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
## 2024-04-17 ENCOUNTER — Ambulatory Visit: Payer: Medicaid Other | Admitting: Adult Health

## 2024-04-17 ENCOUNTER — Encounter: Payer: Self-pay | Admitting: Adult Health

## 2024-04-20 ENCOUNTER — Other Ambulatory Visit: Payer: Self-pay | Admitting: Psychiatry

## 2024-04-20 DIAGNOSIS — F4310 Post-traumatic stress disorder, unspecified: Secondary | ICD-10-CM
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
## 2024-05-07 ENCOUNTER — Telehealth: Admitting: Psychiatry

## 2024-05-29 ENCOUNTER — Ambulatory Visit: Admitting: Psychiatry
